# Patient Record
Sex: Male | Born: 1995 | Race: White | Hispanic: No | Marital: Single | Smoking: Current every day smoker
Health system: Southern US, Community
[De-identification: ages and names within clinical notes are randomized; demographics above are authoritative.]

## PROBLEM LIST (undated history)

## (undated) DIAGNOSIS — F840 Autistic disorder: Secondary | ICD-10-CM

## (undated) DIAGNOSIS — R569 Unspecified convulsions: Secondary | ICD-10-CM

## (undated) DIAGNOSIS — F209 Schizophrenia, unspecified: Secondary | ICD-10-CM

## (undated) DIAGNOSIS — F909 Attention-deficit hyperactivity disorder, unspecified type: Secondary | ICD-10-CM

## (undated) DIAGNOSIS — F913 Oppositional defiant disorder: Secondary | ICD-10-CM

## (undated) DIAGNOSIS — F319 Bipolar disorder, unspecified: Secondary | ICD-10-CM

## (undated) HISTORY — PX: TOOTH EXTRACTION: SUR596

---
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
## 2015-12-04 ENCOUNTER — Emergency Department (HOSPITAL_COMMUNITY)
Admission: EM | Admit: 2015-12-04 | Discharge: 2015-12-05 | Disposition: A | Discharge status: Home / Self Care | Payer: Medicaid Other | Attending: Emergency Medicine | Admitting: Emergency Medicine

## 2015-12-04 DIAGNOSIS — R259 Unspecified abnormal involuntary movements: Secondary | ICD-10-CM | POA: Diagnosis not present

## 2015-12-04 DIAGNOSIS — Z79899 Other long term (current) drug therapy: Secondary | ICD-10-CM | POA: Insufficient documentation

## 2015-12-04 DIAGNOSIS — Z046 Encounter for general psychiatric examination, requested by authority: Secondary | ICD-10-CM

## 2015-12-04 DIAGNOSIS — F918 Other conduct disorders: Secondary | ICD-10-CM | POA: Diagnosis present

## 2015-12-04 DIAGNOSIS — H109 Unspecified conjunctivitis: Secondary | ICD-10-CM | POA: Diagnosis not present

## 2015-12-04 LAB — COMPREHENSIVE METABOLIC PANEL
ALT: 18 U/L (ref 17–63)
ANION GAP: 4 — AB (ref 5–15)
AST: 17 U/L (ref 15–41)
Albumin: 4.4 g/dL (ref 3.5–5.0)
Alkaline Phosphatase: 81 U/L (ref 38–126)
BILIRUBIN TOTAL: 0.6 mg/dL (ref 0.3–1.2)
BUN: 13 mg/dL (ref 6–20)
CHLORIDE: 107 mmol/L (ref 101–111)
CO2: 27 mmol/L (ref 22–32)
Calcium: 9.6 mg/dL (ref 8.9–10.3)
Creatinine, Ser: 1.03 mg/dL (ref 0.61–1.24)
Glucose, Bld: 85 mg/dL (ref 65–99)
POTASSIUM: 4.1 mmol/L (ref 3.5–5.1)
Sodium: 138 mmol/L (ref 135–145)
TOTAL PROTEIN: 7.6 g/dL (ref 6.5–8.1)

## 2015-12-04 LAB — CBC
HCT: 36.6 % — ABNORMAL LOW (ref 39.0–52.0)
Hemoglobin: 12.8 g/dL — ABNORMAL LOW (ref 13.0–17.0)
MCH: 29.4 pg (ref 26.0–34.0)
MCHC: 35 g/dL (ref 30.0–36.0)
MCV: 83.9 fL (ref 78.0–100.0)
PLATELETS: 224 10*3/uL (ref 150–400)
RBC: 4.36 MIL/uL (ref 4.22–5.81)
RDW: 12.1 % (ref 11.5–15.5)
WBC: 5.8 10*3/uL (ref 4.0–10.5)

## 2015-12-04 LAB — RAPID URINE DRUG SCREEN, HOSP PERFORMED
AMPHETAMINES: NOT DETECTED
BENZODIAZEPINES: NOT DETECTED
Barbiturates: NOT DETECTED
COCAINE: NOT DETECTED
OPIATES: NOT DETECTED
Tetrahydrocannabinol: NOT DETECTED

## 2015-12-04 LAB — SALICYLATE LEVEL

## 2015-12-04 LAB — LITHIUM LEVEL

## 2015-12-04 LAB — ETHANOL

## 2015-12-04 LAB — ACETAMINOPHEN LEVEL

## 2015-12-04 MED ORDER — DIPHENHYDRAMINE HCL 25 MG PO CAPS
25.0000 mg | ORAL_CAPSULE | Freq: Once | ORAL | Status: AC
  Administered 2015-12-04: 25 mg via ORAL
  Filled 2015-12-04: qty 1

## 2015-12-04 MED ORDER — ERYTHROMYCIN 5 MG/GM OP OINT
1.0000 "application " | TOPICAL_OINTMENT | Freq: Four times a day (QID) | OPHTHALMIC | Status: DC
  Administered 2015-12-04 – 2015-12-05 (×3): 1 via OPHTHALMIC
  Filled 2015-12-04: qty 3.5

## 2015-12-04 NOTE — ED Notes (Signed)
Bed: WA31 Expected date:  Expected time:  Means of arrival:  Comments: 
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
## 2015-12-04 NOTE — BH Assessment (Signed)
Assessment completed. Consulted Maryjean Mornharles Kober, PA-C who recommended inpatient treatment. Informed Antony MaduraKelly Humes, PA-C of the recommendation.

## 2015-12-04 NOTE — ED Provider Notes (Signed)
[facility]-EMERGENCY DEPT Provider Note   CSN: [id number] Arrival date & time: 12/04/15  1803  By signing my name below, I, Rosario AdieWilliam Andrew Hiatt, attest that this documentation has been prepared under the direction and in the presence of TRW AutomotiveKelly Bedford Winsor, PA-C.  Electronically Signed: Rosario AdieWilliam Andrew Hiatt, ED Scribe. 12/04/15. 8:21 PM.  History   Chief Complaint Chief Complaint  Patient presents with  . Aggressive Behavior  . Suicidal   The history is provided by the patient, a relative and the police. No language interpreter was used.   HPI Comments: [name] is a 20 y.o. male BIB [employer] under IVC, who presents to the Emergency Department with aggressive behavior. Pt reports that his sister put him under IVC because he has been more aggressive in his home recently. Pt reports that he needs new medicines because he has been running out of them, and has been out of his morning medicines x ~1 month. He states that he recently was kicked out of his independent living facility and has been living with his sister, and has been unable to see someone to fill because he has to move away from the area where his old therapist was located. He last took his nightly medications last night. He is not currently followed by a new therapist. His sister also noted on his IVC forms that the pt has been walking in on her while she is changing and staring at her. Denies SI/HI at this time. Denies alcohol or illicit drug usage.   He is also complaining of left eye pain onset PTA. He additionally states that the eye has been pruritic and more red. Denies recent trauma but notes that he has been rubbing it constantly. Denies lash crusting, nasal congestion, rhinorrhea, or recent visual changes.   No past medical history on file.  There are no active problems to display for this patient.  No past surgical history on file.  Home Medications    Prior to Admission medications   Not on File   Family History No family  history on file.  Social History Social History  Substance Use Topics  . Smoking status: Not on file  . Smokeless tobacco: Not on file  . Alcohol use Not on file   Allergies   Review of patient's allergies indicates not on file.  Review of Systems Review of Systems  Constitutional: Negative for fever.  Eyes: Positive for redness. Negative for visual disturbance.  Psychiatric/Behavioral: Positive for behavioral problems. Negative for suicidal ideas.  A complete 10 system review of systems was obtained and all systems are negative except as noted in the HPI and PMH.    Physical Exam Updated Vital Signs BP 122/68 (BP Location: Left Arm)   Pulse 78   Temp 98.3 F (36.8 C) (Oral)   Resp 20   SpO2 99%   Physical Exam  Constitutional: He is oriented to person, place, and time. He appears well-developed and well-nourished. No distress.  Nontoxic appearing  HENT:  Head: Normocephalic and atraumatic.  Eyes: EOM are normal. Left eye exhibits no discharge. Right conjunctiva is not injected. Left conjunctiva is injected. Left conjunctiva has no hemorrhage. No scleral icterus.  No blepharitis or peritorbital edema. Normal EOMs. No subconjunctival hemorrhage, proptosis, or hyphema. No discharge or tearing from the left eye.  Neck: Normal range of motion.  Pulmonary/Chest: Effort normal. No respiratory distress.  Musculoskeletal: Normal range of motion.  Neurological: He is alert and oriented to person, place, and time.  Skin: Skin  is warm and dry. No rash noted. He is not diaphoretic. No erythema. No pallor.  Psychiatric: His speech is rapid and/or pressured. He is hyperactive (mild). He expresses no homicidal and no suicidal ideation.  Nursing note and vitals reviewed.  ED Treatments / Results  DIAGNOSTIC STUDIES: Oxygen Saturation is 99% on RA, normal by my interpretation.   COORDINATION OF CARE: 8:21 PM-Discussed next steps with pt. Pt verbalized understanding and is agreeable  with the plan.   Labs (all labs ordered are listed, but only abnormal results are displayed) Labs Reviewed  COMPREHENSIVE METABOLIC PANEL - Abnormal; Notable for the following:       Result Value   Anion gap 4 (*)    All other components within normal limits  ACETAMINOPHEN LEVEL - Abnormal; Notable for the following:    Acetaminophen (Tylenol), Serum <10 (*)    All other components within normal limits  CBC - Abnormal; Notable for the following:    Hemoglobin 12.8 (*)    HCT 36.6 (*)    All other components within normal limits  LITHIUM LEVEL - Abnormal; Notable for the following:    Lithium Lvl <0.06 (*)    All other components within normal limits  ETHANOL  SALICYLATE LEVEL  URINE RAPID DRUG SCREEN, HOSP PERFORMED   EKG  EKG Interpretation None      Radiology No results found.  Procedures Procedures (including critical care time)  Medications Ordered in ED Medications - No data to display   Initial Impression / Assessment and Plan / ED Course  I have reviewed the triage vital signs and the nursing notes.  Pertinent labs & imaging results that were available during my care of the patient were reviewed by me and considered in my medical decision making (see chart for details).  Clinical Course    Patient medically cleared. He has been prescribed erythromycin for conjunctivitis of left eye. TTS recommending inpatient treatment. Placement pending. Disposition to be determined by oncoming ED provider.   Final Clinical Impressions(s) / ED Diagnoses   Final diagnoses:  Involuntary commitment  Conjunctivitis of left eye    New Prescriptions New Prescriptions   No medications on file   I personally performed the services described in this documentation, which was scribed in my presence. The recorded information has been reviewed and is accurate.       Antony MaduraKelly Chatara Lucente, PA-C 12/05/15 [id number]    Gwyneth SproutWhitney Plunkett, MD 12/06/15 [phone]

## 2015-12-04 NOTE — ED Triage Notes (Signed)
Pt here with [employer].  IVC by sister.  Paper states pt is aggressive at home scratching and hitting himself. Walking in on sister when she is changing and staring at her. Pt has MR and is autistic.  [employer] was called out last night for behavior.  Pt has not been taking his meds as prescribed.  Pt states he is here for his meds only and then he can go home.  Last time he took his meds last night. Says he is out of his morning and 2;00 meds.

## 2015-12-05 MED ORDER — ERYTHROMYCIN 5 MG/GM OP OINT: 1.0000 "application " | TOPICAL_OINTMENT | Freq: Four times a day (QID) | OPHTHALMIC | 0 refills | Status: DC

## 2015-12-05 MED ORDER — BENZTROPINE MESYLATE 1 MG PO TABS
0.5000 mg | ORAL_TABLET | Freq: Every day | ORAL | Status: DC
  Administered 2015-12-05: 1 mg via ORAL
  Filled 2015-12-05: qty 1

## 2015-12-05 MED ORDER — LORATADINE 10 MG PO TABS
10.0000 mg | ORAL_TABLET | Freq: Every day | ORAL | Status: DC
  Administered 2015-12-05: 10 mg via ORAL
  Filled 2015-12-05 (×2): qty 1

## 2015-12-05 MED ORDER — LITHIUM CARBONATE 300 MG PO CAPS
300.0000 mg | ORAL_CAPSULE | Freq: Two times a day (BID) | ORAL | 0 refills | Status: DC
Start: 2015-12-05 — End: 2019-02-12

## 2015-12-05 MED ORDER — CLONIDINE HCL 0.1 MG PO TABS: 0.1000 mg | ORAL_TABLET | Freq: Every day | ORAL | Status: DC

## 2015-12-05 MED ORDER — HALOPERIDOL 5 MG PO TABS
5.0000 mg | ORAL_TABLET | Freq: Three times a day (TID) | ORAL | Status: DC
  Administered 2015-12-05 (×2): 5 mg via ORAL
  Filled 2015-12-05 (×2): qty 1

## 2015-12-05 MED ORDER — CHLORPROMAZINE HCL 25 MG PO TABS: 50.0000 mg | ORAL_TABLET | Freq: Every day | ORAL | Status: DC

## 2015-12-05 MED ORDER — CHLORPROMAZINE HCL 25 MG PO TABS
25.0000 mg | ORAL_TABLET | Freq: Two times a day (BID) | ORAL | Status: DC
  Administered 2015-12-05 (×2): 25 mg via ORAL
  Filled 2015-12-05 (×2): qty 1

## 2015-12-05 MED ORDER — LITHIUM CARBONATE 300 MG PO CAPS: 1500.0000 mg | ORAL_CAPSULE | Freq: Every day | ORAL | Status: DC

## 2015-12-05 NOTE — [facility] Suicide Risk Assessment (Cosign Needed)
Suicide Risk Assessment  Discharge Assessment   [facility]   Principal Problem: <principal problem not specified> Discharge Diagnoses: There are no active problems to display for this patient.   Total Time spent with patient: 30 minutes  Musculoskeletal: Strength & Muscle Tone: within normal limits Gait & Station: normal Patient leans: N/A  Psychiatric Specialty Exam:     Blood pressure 128/68, pulse 67, temperature 97.6 F (36.4 C), temperature source Oral, resp. rate 14, SpO2 99 %.There is no height or weight on file to calculate BMI.  General Appearance: Casual  Eye Contact::  Good  Speech:  Clear and Coherent  Volume:  Normal  Mood:  Euthymic  Affect:  Appropriate  Thought Process:  Coherent  Orientation:  Full (Time, Place, and Person)  Thought Content:  Logical  Suicidal Thoughts:  No  Homicidal Thoughts:  No  Memory:  Immediate;   Fair Recent;   Fair Remote;   Fair  Judgement:  Fair  Insight:  Fair  Psychomotor Activity:  Normal  Concentration:  Good  Recall:  Good  Fund of Knowledge:Good  Language: Good  Akathisia:  Negative  Handed:  Right  AIMS (if indicated):     Assets:  Housing  Sleep:     Cognition: WNL  ADL's:  Intact   Mental Status Per Nursing Assessment::   On Admission:     Demographic Factors:  Adolescent or young adult and autism, MR  Loss Factors: NA  Historical Factors: Impulsivity  Risk Reduction Factors:   Positive social support  Continued Clinical Symptoms:  Medical Diagnoses and Treatments/Surgeries  Cognitive Features That Contribute To Risk:  Closed-mindedness    Suicide Risk:  Minimal: No identifiable suicidal ideation.  Patients presenting with no risk factors but with morbid ruminations; may be classified as minimal risk based on the severity of the depressive symptoms   Plan Of Care/Follow-up recommendations:  Activity:  as tol Diet:  as tol  Patient instructed to care for  irritated eye which he reports as "pink eye"  With warm water.   Will be restarted on Lithium 300 mg BID (lowest dose to ensure safety)  Lindwood QuaSheila May Duy Lemming, NP [facility] 12/05/2015, 4:18 PM

## 2015-12-31 ENCOUNTER — Telehealth: Payer: Self-pay | Admitting: Nurse Practitioner

## 2016-03-29 ENCOUNTER — Encounter (HOSPITAL_COMMUNITY): Payer: Self-pay | Admitting: Emergency Medicine

## 2016-03-29 ENCOUNTER — Emergency Department (HOSPITAL_COMMUNITY)
Admission: EM | Admit: 2016-03-29 | Discharge: 2016-03-30 | Disposition: A | Discharge status: Home / Self Care | Payer: Medicaid Other | Attending: Emergency Medicine | Admitting: Emergency Medicine

## 2016-03-29 DIAGNOSIS — Z79899 Other long term (current) drug therapy: Secondary | ICD-10-CM | POA: Diagnosis not present

## 2016-03-29 DIAGNOSIS — R112 Nausea with vomiting, unspecified: Secondary | ICD-10-CM | POA: Diagnosis not present

## 2016-03-29 DIAGNOSIS — F17210 Nicotine dependence, cigarettes, uncomplicated: Secondary | ICD-10-CM | POA: Diagnosis not present

## 2016-03-29 DIAGNOSIS — F840 Autistic disorder: Secondary | ICD-10-CM | POA: Diagnosis not present

## 2016-03-29 DIAGNOSIS — R1110 Vomiting, unspecified: Secondary | ICD-10-CM

## 2016-03-29 HISTORY — DX: Autistic disorder: F84.0

## 2016-03-29 HISTORY — DX: Unspecified convulsions: R56.9

## 2016-03-29 LAB — URINALYSIS, ROUTINE W REFLEX MICROSCOPIC
BILIRUBIN URINE: NEGATIVE
Glucose, UA: NEGATIVE mg/dL
HGB URINE DIPSTICK: NEGATIVE
KETONES UR: 5 mg/dL — AB
Leukocytes, UA: NEGATIVE
NITRITE: NEGATIVE
PH: 7 (ref 5.0–8.0)
Protein, ur: NEGATIVE mg/dL
Specific Gravity, Urine: 1.015 (ref 1.005–1.030)

## 2016-03-29 LAB — COMPREHENSIVE METABOLIC PANEL
ALBUMIN: 4.7 g/dL (ref 3.5–5.0)
ALK PHOS: 91 U/L (ref 38–126)
ALT: 15 U/L — ABNORMAL LOW (ref 17–63)
AST: 18 U/L (ref 15–41)
Anion gap: 6 (ref 5–15)
BILIRUBIN TOTAL: 1 mg/dL (ref 0.3–1.2)
BUN: 14 mg/dL (ref 6–20)
CALCIUM: 9.5 mg/dL (ref 8.9–10.3)
CO2: 25 mmol/L (ref 22–32)
Chloride: 106 mmol/L (ref 101–111)
Creatinine, Ser: 0.9 mg/dL (ref 0.61–1.24)
GFR calc Af Amer: >60 mL/min (ref >60)
GFR calc non Af Amer: >60 mL/min (ref >60)
GLUCOSE: 104 mg/dL — AB (ref 65–99)
Potassium: 3.7 mmol/L (ref 3.5–5.1)
Sodium: 137 mmol/L (ref 135–145)
TOTAL PROTEIN: 7.6 g/dL (ref 6.5–8.1)

## 2016-03-29 LAB — CBC
HEMATOCRIT: 44.2 % (ref 39.0–52.0)
Hemoglobin: 15.4 g/dL (ref 13.0–17.0)
MCH: 29.8 pg (ref 26.0–34.0)
MCHC: 34.8 g/dL (ref 30.0–36.0)
MCV: 85.7 fL (ref 78.0–100.0)
Platelets: 199 10*3/uL (ref 150–400)
RBC: 5.16 MIL/uL (ref 4.22–5.81)
RDW: 12.4 % (ref 11.5–15.5)
WBC: 13.8 10*3/uL — ABNORMAL HIGH (ref 4.0–10.5)

## 2016-03-29 LAB — LIPASE, BLOOD: Lipase: 20 U/L (ref 11–51)

## 2016-03-29 MED ORDER — DICYCLOMINE HCL 10 MG PO CAPS
10.0000 mg | ORAL_CAPSULE | Freq: Once | ORAL | Status: AC
  Administered 2016-03-29: 10 mg via ORAL
  Filled 2016-03-29: qty 1

## 2016-03-29 MED ORDER — SODIUM CHLORIDE 0.9 % IV BOLUS (SEPSIS)
1000.0000 mL | Freq: Once | INTRAVENOUS | Status: AC
  Administered 2016-03-29: 1000 mL via INTRAVENOUS

## 2016-03-29 MED ORDER — ONDANSETRON HCL 4 MG/2ML IJ SOLN
4.0000 mg | Freq: Once | INTRAMUSCULAR | Status: AC
  Administered 2016-03-29: 4 mg via INTRAVENOUS
  Filled 2016-03-29: qty 2

## 2016-03-29 NOTE — ED Notes (Signed)
Pt requesting UDS due to recent drug use

## 2016-03-29 NOTE — ED Triage Notes (Signed)
[facility] is from home [facility] Patient is complaining of nausea, vomiting, and left flank pain that radiates around abd to right lower quad. Pt takes Lithium but not has he is prescribed.

## 2016-03-29 NOTE — ED Provider Notes (Signed)
[facility]-EMERGENCY DEPT Provider Note   CSN: [id number] Arrival date & time: 03/29/16  [id number]     History   Chief Complaint Chief Complaint  Patient presents with  . Flank Pain  . Emesis    HPI [name] is a 20 y.o. male.  Patient is a 20 year old male with a history of autism and seizures who presents with abdominal pain and vomiting. He states he ate honey bun earlier today and started vomiting. He has some associated left-sided crampy abdominal pain. No diarrhea. No known fevers. No cough or congestion. No hematemesis. He has a friend who had similar symptoms recently.      Past Medical History:  Diagnosis Date  . Autism   . Seizures (HCC)    none since 20 years old    There are no active problems to display for this patient.   Past Surgical History:  Procedure Laterality Date  . TOOTH EXTRACTION         Home Medications    Prior to Admission medications   Medication Sig Start Date End Date Taking? Authorizing Provider  diphenhydrAMINE (BENADRYL) 25 MG tablet Take 25 mg by mouth every 6 (six) hours as needed for sleep.   Yes Historical Provider, MD  OXcarbazepine (TRILEPTAL) 150 MG tablet Take 150 mg by mouth 2 (two) times daily.   Yes Historical Provider, MD  benztropine (COGENTIN) 0.5 MG tablet Take 0.5 mg by mouth daily.    Historical Provider, MD  cetirizine (ZYRTEC) 10 MG tablet Take 10 mg by mouth daily.    Historical Provider, MD  chlorproMAZINE (THORAZINE) 25 MG tablet Take 25 mg by mouth 2 (two) times daily. Am and afternoon 08/11/15   Historical Provider, MD  chlorproMAZINE (THORAZINE) 50 MG tablet Take 50 mg by mouth at bedtime.    Historical Provider, MD  cloNIDine (CATAPRES) 0.1 MG tablet Take 0.1 mg by mouth at bedtime.    Historical Provider, MD  erythromycin ophthalmic ointment Place 1 application into the left eye every 6 (six) hours. Left eye irritation Patient not taking: Reported on 03/29/2016 12/05/15   Adonis BrookSheila Agustin, NP  haloperidol  (HALDOL) 5 MG tablet Take 5 mg by mouth 3 (three) times daily. 08/11/15   Historical Provider, MD  lithium carbonate 300 MG capsule Take 1 capsule (300 mg total) by mouth 2 (two) times daily with a meal. 12/05/15   Adonis BrookSheila Agustin, NP  paliperidone (INVEGA SUSTENNA) 156 MG/ML SUSP injection Inject 156 mg into the muscle every 28 (twenty-eight) days. July 2017    Historical Provider, MD    Family History No family history on file.  Social History Social History  Substance Use Topics  . Smoking status: Current Some Day Smoker    Packs/day: 1.00    Types: Cigarettes  . Smokeless tobacco: Never Used  . Alcohol use No     Allergies   Other   Review of Systems Review of Systems  Constitutional: Negative for chills, diaphoresis, fatigue and fever.  HENT: Negative for congestion, rhinorrhea and sneezing.   Eyes: Negative.   Respiratory: Negative for cough, chest tightness and shortness of breath.   Cardiovascular: Negative for chest pain and leg swelling.  Gastrointestinal: Positive for abdominal pain, nausea and vomiting. Negative for blood in stool and diarrhea.  Genitourinary: Negative for difficulty urinating, flank pain, frequency and hematuria.  Musculoskeletal: Negative for arthralgias and back pain.  Skin: Negative for rash.  Neurological: Negative for dizziness, speech difficulty, weakness, numbness and headaches.     Physical  Exam Updated Vital Signs BP 123/77 (BP Location: Left Arm)   Pulse 92   Temp 98.3 F (36.8 C) (Oral)   Resp 18   Ht 5' 8.5" (1.74 m)   SpO2 98%   Physical Exam  Constitutional: He is oriented to person, place, and time. He appears well-developed and well-nourished.  HENT:  Head: Normocephalic and atraumatic.  Eyes: Pupils are equal, round, and reactive to light.  Neck: Normal range of motion. Neck supple.  Cardiovascular: Normal rate, regular rhythm and normal heart sounds.   Pulmonary/Chest: Effort normal and breath sounds normal. No  respiratory distress. He has no wheezes. He has no rales. He exhibits no tenderness.  Abdominal: Soft. Bowel sounds are normal. There is tenderness (Mild tenderness diffusely). There is no rebound and no guarding.  Musculoskeletal: Normal range of motion. He exhibits no edema.  Lymphadenopathy:    He has no cervical adenopathy.  Neurological: He is alert and oriented to person, place, and time.  Skin: Skin is warm and dry. No rash noted.  Psychiatric: He has a normal mood and affect.     ED Treatments / Results  Labs (all labs ordered are listed, but only abnormal results are displayed) Labs Reviewed  COMPREHENSIVE METABOLIC PANEL - Abnormal; Notable for the following:       Result Value   Glucose, Bld 104 (*)    ALT 15 (*)    All other components within normal limits  CBC - Abnormal; Notable for the following:    WBC 13.8 (*)    All other components within normal limits  URINALYSIS, ROUTINE W REFLEX MICROSCOPIC - Abnormal; Notable for the following:    Ketones, ur 5 (*)    All other components within normal limits  LIPASE, BLOOD  RAPID URINE DRUG SCREEN, HOSP PERFORMED    EKG  EKG Interpretation None       Radiology No results found.  Procedures Procedures (including critical care time)  Medications Ordered in ED Medications  sodium chloride 0.9 % bolus 1,000 mL (0 mLs Intravenous Stopped 03/29/16 2315)  ondansetron (ZOFRAN) injection 4 mg (4 mg Intravenous Given 03/29/16 2125)  dicyclomine (BENTYL) capsule 10 mg (10 mg Oral Given 03/29/16 2125)     Initial Impression / Assessment and Plan / ED Course  I have reviewed the triage vital signs and the nursing notes.  Pertinent labs & imaging results that were available during my care of the patient were reviewed by me and considered in my medical decision making (see chart for details).  Clinical Course     Patient presents with nausea vomiting abdominal pain. He was given IV fluids Bentyl and Zofran. He's  feeling much better and wants to go home. His labs are non-concerning. He has no ongoing abdominal pain. He was discharged home in good condition. Return precautions were given.  Final Clinical Impressions(s) / ED Diagnoses   Final diagnoses:  Non-intractable vomiting, presence of nausea not specified, unspecified vomiting type    New Prescriptions New Prescriptions   No medications on file     Rolan BuccoMelanie Particia Strahm, MD 03/29/16 2330

## 2016-03-30 LAB — RAPID URINE DRUG SCREEN, HOSP PERFORMED
Amphetamines: NOT DETECTED
BENZODIAZEPINES: NOT DETECTED
Barbiturates: NOT DETECTED
COCAINE: NOT DETECTED
OPIATES: NOT DETECTED
Tetrahydrocannabinol: POSITIVE — AB

## 2016-05-20 ENCOUNTER — Emergency Department (HOSPITAL_COMMUNITY): Payer: Medicaid Other

## 2016-05-20 ENCOUNTER — Encounter (HOSPITAL_COMMUNITY): Payer: Self-pay | Admitting: Emergency Medicine

## 2016-05-20 ENCOUNTER — Emergency Department (HOSPITAL_COMMUNITY)
Admission: EM | Admit: 2016-05-20 | Discharge: 2016-05-20 | Discharge status: Against Medical Advice | Payer: Medicaid Other | Attending: Emergency Medicine | Admitting: Emergency Medicine

## 2016-05-20 DIAGNOSIS — J111 Influenza due to unidentified influenza virus with other respiratory manifestations: Secondary | ICD-10-CM | POA: Insufficient documentation

## 2016-05-20 DIAGNOSIS — Z79899 Other long term (current) drug therapy: Secondary | ICD-10-CM | POA: Insufficient documentation

## 2016-05-20 DIAGNOSIS — F840 Autistic disorder: Secondary | ICD-10-CM | POA: Insufficient documentation

## 2016-05-20 DIAGNOSIS — F17210 Nicotine dependence, cigarettes, uncomplicated: Secondary | ICD-10-CM | POA: Insufficient documentation

## 2016-05-20 DIAGNOSIS — R05 Cough: Secondary | ICD-10-CM | POA: Diagnosis present

## 2016-05-20 DIAGNOSIS — R69 Illness, unspecified: Secondary | ICD-10-CM

## 2016-05-20 LAB — CBC WITH DIFFERENTIAL/PLATELET
Basophils Absolute: 0 10*3/uL (ref 0.0–0.1)
Basophils Relative: 0 %
EOS ABS: 0 10*3/uL (ref 0.0–0.7)
Eosinophils Relative: 0 %
HEMATOCRIT: 44.6 % (ref 39.0–52.0)
HEMOGLOBIN: 15.1 g/dL (ref 13.0–17.0)
LYMPHS ABS: 1 10*3/uL (ref 0.7–4.0)
Lymphocytes Relative: 13 %
MCH: 28.7 pg (ref 26.0–34.0)
MCHC: 33.9 g/dL (ref 30.0–36.0)
MCV: 84.8 fL (ref 78.0–100.0)
MONOS PCT: 13 %
Monocytes Absolute: 1 10*3/uL (ref 0.1–1.0)
NEUTROS ABS: 5.7 10*3/uL (ref 1.7–7.7)
NEUTROS PCT: 74 %
Platelets: 160 10*3/uL (ref 150–400)
RBC: 5.26 MIL/uL (ref 4.22–5.81)
RDW: 12.9 % (ref 11.5–15.5)
WBC: 7.8 10*3/uL (ref 4.0–10.5)

## 2016-05-20 LAB — URINALYSIS, ROUTINE W REFLEX MICROSCOPIC
BILIRUBIN URINE: NEGATIVE
Glucose, UA: NEGATIVE mg/dL
Hgb urine dipstick: NEGATIVE
Ketones, ur: 5 mg/dL — AB
LEUKOCYTES UA: NEGATIVE
NITRITE: NEGATIVE
PH: 7 (ref 5.0–8.0)
Protein, ur: NEGATIVE mg/dL
SPECIFIC GRAVITY, URINE: 1.021 (ref 1.005–1.030)

## 2016-05-20 LAB — COMPREHENSIVE METABOLIC PANEL
ALBUMIN: 4.7 g/dL (ref 3.5–5.0)
ALK PHOS: 83 U/L (ref 38–126)
ALT: 14 U/L — AB (ref 17–63)
AST: 18 U/L (ref 15–41)
Anion gap: 10 (ref 5–15)
BILIRUBIN TOTAL: 0.7 mg/dL (ref 0.3–1.2)
BUN: 17 mg/dL (ref 6–20)
CALCIUM: 9.6 mg/dL (ref 8.9–10.3)
CO2: 24 mmol/L (ref 22–32)
CREATININE: 0.91 mg/dL (ref 0.61–1.24)
Chloride: 102 mmol/L (ref 101–111)
GFR calc non Af Amer: >60 mL/min (ref >60)
GLUCOSE: 100 mg/dL — AB (ref 65–99)
Potassium: 3.5 mmol/L (ref 3.5–5.1)
SODIUM: 136 mmol/L (ref 135–145)
TOTAL PROTEIN: 8 g/dL (ref 6.5–8.1)

## 2016-05-20 LAB — LIPASE, BLOOD: Lipase: 13 U/L (ref 11–51)

## 2016-05-20 MED ORDER — KETOROLAC TROMETHAMINE 30 MG/ML IJ SOLN
30.0000 mg | Freq: Once | INTRAMUSCULAR | Status: AC
  Administered 2016-05-20: 30 mg via INTRAVENOUS
  Filled 2016-05-20: qty 1

## 2016-05-20 NOTE — ED Provider Notes (Signed)
[facility]-EMERGENCY DEPT Provider Note   CSN: [id number] Arrival date & time: 05/20/16  1930     History   Chief Complaint Chief Complaint  Patient presents with  . Cough  . Dysuria    HPI Lupe CarneyShawn Cerreta is a 21 y.o. male.  HPI  21 year old male who presents with cough, body aches, fever and chills for 2 days. His sister has been ill w/ nausea, vomiting and diarrhea. He has noticed associated burning w/ urination and frequency. No penile discharge, flank pain, nausea, vomiting, or diarrhea. Subjective fevers at home. He has a history of seizure disorder and autism spectrum disorder. Took Tylenol earlier today, without improvement in his body aches.  Past Medical History:  Diagnosis Date  . Autism   . Seizures (HCC)    none since 21 years old    There are no active problems to display for this patient.   Past Surgical History:  Procedure Laterality Date  . TOOTH EXTRACTION         Home Medications    Prior to Admission medications   Medication Sig Start Date End Date Taking? Authorizing Provider  acetaminophen (TYLENOL) 500 MG tablet Take 1,000 mg by mouth every 6 (six) hours as needed for mild pain.   Yes Historical Provider, MD  diphenhydrAMINE (BENADRYL) 25 MG tablet Take 25 mg by mouth every 6 (six) hours as needed for sleep.   Yes Historical Provider, MD  erythromycin ophthalmic ointment Place 1 application into the left eye every 6 (six) hours. Left eye irritation Patient not taking: Reported on 03/29/2016 12/05/15   Adonis BrookSheila Agustin, NP  lithium carbonate 300 MG capsule Take 1 capsule (300 mg total) by mouth 2 (two) times daily with a meal. Patient not taking: Reported on 05/20/2016 12/05/15   Adonis BrookSheila Agustin, NP    Family History History reviewed. No pertinent family history.  Social History Social History  Substance Use Topics  . Smoking status: Current Some Day Smoker    Packs/day: 1.00    Types: Cigarettes  . Smokeless tobacco: Never Used  . Alcohol use No      Allergies   Other   Review of Systems Review of Systems 10/14 systems reviewed and are negative other than those stated in the HPI   Physical Exam Updated Vital Signs BP 117/79   Pulse 101   Temp 99.3 F (37.4 C) (Oral)   Resp 18   SpO2 98%   Physical Exam Physical Exam  Nursing note and vitals reviewed. Constitutional: non-toxic, and in no acute distress Head: Normocephalic and atraumatic.  Mouth/Throat: Oropharynx is clear and moist.  Neck: Normal range of motion. Neck supple.  Cardiovascular: Normal rate and regular rhythm.   Pulmonary/Chest: Effort normal and breath sounds normal.  Abdominal: Soft. There is no tenderness. There is no rebound and no guarding.  Musculoskeletal: Normal range of motion.  Neurological: Alert, no facial droop, fluent speech, moves all extremities symmetrically Skin: Skin is warm and dry.  Psychiatric: Cooperative   ED Treatments / Results  Labs (all labs ordered are listed, but only abnormal results are displayed) Labs Reviewed  URINALYSIS, ROUTINE W REFLEX MICROSCOPIC - Abnormal; Notable for the following:       Result Value   Ketones, ur 5 (*)    All other components within normal limits  COMPREHENSIVE METABOLIC PANEL - Abnormal; Notable for the following:    Glucose, Bld 100 (*)    ALT 14 (*)    All other components within normal  limits  URINE CULTURE  CBC WITH DIFFERENTIAL/PLATELET  LIPASE, BLOOD    EKG  EKG Interpretation None       Radiology Dg Chest 2 View  Result Date: 05/20/2016 CLINICAL DATA:  Acute onset of cough and generalized body aches. Dysuria. Initial encounter. EXAM: CHEST  2 VIEW COMPARISON:  None. FINDINGS: The lungs are well-aerated and clear. There is no evidence of focal opacification, pleural effusion or pneumothorax. The heart is normal in size; the mediastinal contour is within normal limits. No acute osseous abnormalities are seen. IMPRESSION: No acute cardiopulmonary process seen.  Electronically Signed   By: Roanna Raider M.D.   On: 05/20/2016 20:42    Procedures Procedures (including critical care time)  Medications Ordered in ED Medications  ketorolac (TORADOL) 30 MG/ML injection 30 mg (30 mg Intravenous Given 05/20/16 2017)     Initial Impression / Assessment and Plan / ED Course  I have reviewed the triage vital signs and the nursing notes.  Pertinent labs & imaging results that were available during my care of the patient were reviewed by me and considered in my medical decision making (see chart for details).     Patient is well-appearing in no acute distress with stable vital signs. Presenting with influenza-like illness. Blood work reassuring and chest x-ray shows no acute cardiopulmonary processes such as pneumonia or edema. Although having urinary symptoms, his urine is clear without evidence of UTI. Patient left prior to me discussed supportive care management for potential discharge home.   Final Clinical Impressions(s) / ED Diagnoses   Final diagnoses:  Influenza-like illness    New Prescriptions New Prescriptions   No medications on file     Lavera Guise, MD 05/20/16 2234
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
## 2016-05-20 NOTE — ED Notes (Signed)
Asked pt if he could give a urine sample, but said he's not able to. 

## 2016-05-20 NOTE — ED Triage Notes (Signed)
Brought in by EMS from home with c/o cough for 2 days and dysuria.  Pt also reports "headache on coughing" and increased urinary frequency and urgency.  Temp T98.6 by EMS.  Pt denies chills, nausea or vomiting.

## 2016-05-22 LAB — URINE CULTURE: Culture: <10000 — AB

## 2016-06-05 ENCOUNTER — Encounter (HOSPITAL_COMMUNITY): Payer: Self-pay | Admitting: *Deleted

## 2016-06-05 ENCOUNTER — Emergency Department (HOSPITAL_COMMUNITY): Payer: Medicaid Other

## 2016-06-05 ENCOUNTER — Emergency Department (HOSPITAL_COMMUNITY)
Admission: EM | Admit: 2016-06-05 | Discharge: 2016-06-05 | Disposition: A | Discharge status: Home / Self Care | Payer: Medicaid Other | Attending: Emergency Medicine | Admitting: Emergency Medicine

## 2016-06-05 DIAGNOSIS — F840 Autistic disorder: Secondary | ICD-10-CM | POA: Diagnosis not present

## 2016-06-05 DIAGNOSIS — Y939 Activity, unspecified: Secondary | ICD-10-CM | POA: Diagnosis not present

## 2016-06-05 DIAGNOSIS — Z79899 Other long term (current) drug therapy: Secondary | ICD-10-CM | POA: Insufficient documentation

## 2016-06-05 DIAGNOSIS — Y999 Unspecified external cause status: Secondary | ICD-10-CM | POA: Insufficient documentation

## 2016-06-05 DIAGNOSIS — Y9289 Other specified places as the place of occurrence of the external cause: Secondary | ICD-10-CM | POA: Insufficient documentation

## 2016-06-05 DIAGNOSIS — M545 Low back pain, unspecified: Secondary | ICD-10-CM

## 2016-06-05 DIAGNOSIS — W2201XA Walked into wall, initial encounter: Secondary | ICD-10-CM | POA: Insufficient documentation

## 2016-06-05 DIAGNOSIS — S62316A Displaced fracture of base of fifth metacarpal bone, right hand, initial encounter for closed fracture: Secondary | ICD-10-CM | POA: Diagnosis not present

## 2016-06-05 DIAGNOSIS — F17210 Nicotine dependence, cigarettes, uncomplicated: Secondary | ICD-10-CM | POA: Insufficient documentation

## 2016-06-05 DIAGNOSIS — S6991XA Unspecified injury of right wrist, hand and finger(s), initial encounter: Secondary | ICD-10-CM | POA: Diagnosis present

## 2016-06-05 MED ORDER — IBUPROFEN 200 MG PO TABS
600.0000 mg | ORAL_TABLET | Freq: Once | ORAL | Status: AC
  Administered 2016-06-05: 600 mg via ORAL
  Filled 2016-06-05: qty 3
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
## 2016-06-05 NOTE — ED Notes (Signed)
Informed by Radiology Tech that pt was able to get up and transfer from wheelchair onto X-ray table for x-ray.
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
## 2016-06-16 ENCOUNTER — Ambulatory Visit: Payer: Self-pay | Admitting: Family Medicine
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
## 2016-08-17 NOTE — Congregational Nurse Program (Signed)
Congregational Nurse Program Note  Date of Encounter: 08/15/2016  Past Medical History: Past Medical History:  Diagnosis Date  . Autism   . Seizures (HCC)    none since 21 years old    Encounter Details:     CNP Questionnaire - 08/15/16 0955      Patient Demographics   Is this a new or existing patient? Existing   Patient is considered a/an Not Applicable   Race Caucasian/White     Patient Assistance   Location of Patient Assistance Not Applicable   Patient's financial/insurance status Low Income;Medicaid   Uninsured Patient ([employer], trade union) No   Patient referred to apply for the following financial assistance Not Applicable   Food insecurities addressed Not Applicable   Transportation assistance No   Assistance securing medications No   Educational health offerings Behavioral health;Navigating the healthcare system;Safety     Encounter Details   Primary purpose of visit Navigating the Healthcare System;Chronic Illness/Condition Visit;Safety   Was an Emergency Department visit averted? Not Applicable   Does patient have a medical provider? No   Patient referred to Clinic   Was a mental health screening completed? (GAINS tool) No   Does patient have dental issues? No   Does patient have vision issues? No   Does your patient have an abnormal blood pressure today? No   Since previous encounter, have you referred patient for abnormal blood pressure that resulted in a new diagnosis or medication change? No   Does your patient have an abnormal blood glucose today? No   Since previous encounter, have you referred patient for abnormal blood glucose that resulted in a new diagnosis or medication change? No   Was there a life-saving intervention made? No     Client reported he has followed through with [facility] for the intake.  Is scheduled on May 2 for evaluation and medication clinic.  He has left the place he was staying and is now living on the streets.

## 2016-08-23 ENCOUNTER — Emergency Department (HOSPITAL_COMMUNITY): Payer: Medicaid Other

## 2016-08-23 ENCOUNTER — Emergency Department (HOSPITAL_COMMUNITY)
Admission: EM | Admit: 2016-08-23 | Discharge: 2016-08-23 | Disposition: A | Discharge status: Home / Self Care | Payer: Medicaid Other | Attending: Emergency Medicine | Admitting: Emergency Medicine

## 2016-08-23 ENCOUNTER — Encounter (HOSPITAL_COMMUNITY): Payer: Self-pay | Admitting: *Deleted

## 2016-08-23 DIAGNOSIS — F840 Autistic disorder: Secondary | ICD-10-CM | POA: Insufficient documentation

## 2016-08-23 DIAGNOSIS — Y929 Unspecified place or not applicable: Secondary | ICD-10-CM | POA: Insufficient documentation

## 2016-08-23 DIAGNOSIS — S8001XA Contusion of right knee, initial encounter: Secondary | ICD-10-CM | POA: Insufficient documentation

## 2016-08-23 DIAGNOSIS — Y9389 Activity, other specified: Secondary | ICD-10-CM | POA: Insufficient documentation

## 2016-08-23 DIAGNOSIS — W228XXA Striking against or struck by other objects, initial encounter: Secondary | ICD-10-CM | POA: Diagnosis not present

## 2016-08-23 DIAGNOSIS — S8991XA Unspecified injury of right lower leg, initial encounter: Secondary | ICD-10-CM | POA: Diagnosis present

## 2016-08-23 DIAGNOSIS — Y999 Unspecified external cause status: Secondary | ICD-10-CM | POA: Insufficient documentation

## 2016-08-23 DIAGNOSIS — F17210 Nicotine dependence, cigarettes, uncomplicated: Secondary | ICD-10-CM | POA: Diagnosis not present

## 2016-08-23 MED ORDER — IBUPROFEN 200 MG PO TABS
600.0000 mg | ORAL_TABLET | Freq: Once | ORAL | Status: AC
  Administered 2016-08-23: 600 mg via ORAL
  Filled 2016-08-23: qty 3
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
## 2016-08-23 NOTE — Discharge Instructions (Signed)
Read the information below.  Use the prescribed medication as directed.  Please discuss all new medications with your pharmacist.  You may return to the Emergency Department at any time for worsening condition or any new symptoms that concern you.     If you develop uncontrolled pain, weakness or numbness of the extremity, severe discoloration of the skin, or you are unable to walk or move your knee, return to the ER for a recheck.    °
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
## 2016-09-14 NOTE — Congregational Nurse Program (Signed)
Congregational Nurse Program Note  Date of Encounter: 08/26/2016  Past Medical History: Past Medical History:  Diagnosis Date  . Autism   . Seizures (HCC)    none since 21 years old    Encounter Details:     CNP Questionnaire - 08/26/16 1532      Patient Demographics   Is this a new or existing patient? Existing   Patient is considered a/an Not Applicable   Race Caucasian/White     Patient Assistance   Location of Patient Assistance Not Applicable   Patient's financial/insurance status Low Income;Medicaid   Uninsured Patient ([employer], [employer]) No   Patient referred to apply for the following financial assistance Not Applicable   Food insecurities addressed Not Applicable   Transportation assistance No   Assistance securing medications No   Educational health offerings Behavioral health;Navigating the healthcare system;Safety     Encounter Details   Primary purpose of visit Navigating the Healthcare System;Chronic Illness/Condition Visit;Safety   Was an Emergency Department visit averted? Not Applicable   Does patient have a medical provider? No   Patient referred to Clinic   Was a mental health screening completed? (GAINS tool) No   Does patient have dental issues? No   Does patient have vision issues? No   Does your patient have an abnormal blood pressure today? No   Since previous encounter, have you referred patient for abnormal blood pressure that resulted in a new diagnosis or medication change? No   Does your patient have an abnormal blood glucose today? No   Since previous encounter, have you referred patient for abnormal blood glucose that resulted in a new diagnosis or medication change? No   Was there a life-saving intervention made? No     Client more animated.  [place]  Bus leaves later today.

## 2016-09-14 NOTE — Congregational Nurse Program (Signed)
Congregational Nurse Program Note  Date of Encounter: 08/17/2016  Past Medical History: Past Medical History:  Diagnosis Date  . Autism   . Seizures (HCC)    none since 21 years old    Encounter Details:     CNP Questionnaire - 08/17/16 1529      Patient Demographics   Is this a new or existing patient? Existing   Patient is considered a/an Not Applicable   Race Caucasian/White     Patient Assistance   Location of Patient Assistance Not Applicable   Patient's financial/insurance status Low Income;Medicaid   Uninsured Patient ([employer]) No   Patient referred to apply for the following financial assistance Not Applicable   Food insecurities addressed Not Applicable   Transportation assistance No   Assistance securing medications No   Educational health offerings Behavioral health;Navigating the healthcare system;Safety     Encounter Details   Primary purpose of visit Navigating the Healthcare System;Chronic Illness/Condition Visit;Safety   Was an Emergency Department visit averted? Not Applicable   Does patient have a medical provider? No   Patient referred to Clinic   Was a mental health screening completed? (GAINS tool) No   Does patient have dental issues? No   Does patient have vision issues? No   Does your patient have an abnormal blood pressure today? No   Since previous encounter, have you referred patient for abnormal blood pressure that resulted in a new diagnosis or medication change? No   Does your patient have an abnormal blood glucose today? No   Since previous encounter, have you referred patient for abnormal blood glucose that resulted in a new diagnosis or medication change? No   Was there a life-saving intervention made? No     Client was supposed to go to [facility].  He could not locate his medication list to [facility] to [facility].  States he sold his phone yesterday.  Contacted [facility] to resend medication list.  List faxed and  copy given to client.

## 2016-09-14 NOTE — Congregational Nurse Program (Signed)
Congregational Nurse Program Note  Date of Encounter: 09/14/2016  Past Medical History: Past Medical History:  Diagnosis Date  . Autism   . Seizures ([place])    none since 21 years old    Encounter Details:     CNP Questionnaire - 09/14/16 1534      Patient Demographics   Is this a new or existing patient? Existing   Patient is considered a/an Not Applicable   Race Caucasian/White     Patient Assistance   Location of Patient Assistance Not Applicable   Patient's financial/insurance status Low Income;Medicaid   Uninsured Patient ([employer]) No   Patient referred to apply for the following financial assistance Not Applicable   Food insecurities addressed Not Applicable   Transportation assistance No   Assistance securing medications No   Educational health offerings Behavioral health;Navigating the healthcare system;Safety     Encounter Details   Primary purpose of visit Navigating the Healthcare System;Chronic Illness/Condition Visit;Safety   Was an Emergency Department visit averted? Not Applicable   Does patient have a medical provider? No   Patient referred to Clinic   Was a mental health screening completed? (GAINS tool) No   Does patient have dental issues? No   Does patient have vision issues? No   Does your patient have an abnormal blood pressure today? No   Since previous encounter, have you referred patient for abnormal blood pressure that resulted in a new diagnosis or medication change? No   Does your patient have an abnormal blood glucose today? No   Since previous encounter, have you referred patient for abnormal blood glucose that resulted in a new diagnosis or medication change? No   Was there a life-saving intervention made? No     I saw client in the lobby of [facility] and he asked to speak to me.  His speech was slow and difficult to understand.  He was vague about his return from [place], just stating it did not "work out".  Is now sleeping on the  streets.  States he has decided to stop taking his mental health meds.  Discussed with him the importance of continuing medications and treatment.  He refuses.  When asked what he wanted from his time with me, stated he wants help with housing.  Client has a history of being in a group home and by his presentation during visits, it is questionable if client would be able to manage living alone.  He does have a payor, which is his sister.  I asked to speak with his sister and he declined.  Informed client that I needed to talk to him and his sister together about housing.  Client attempted to call his sister, no answer, he left a message.    I met with [facility] team director to discuss case.  He was already aware of client and will assist in getting client appropriately placed, if possible.

## 2016-10-03 ENCOUNTER — Encounter (HOSPITAL_COMMUNITY): Payer: Self-pay

## 2016-10-03 DIAGNOSIS — F17210 Nicotine dependence, cigarettes, uncomplicated: Secondary | ICD-10-CM | POA: Insufficient documentation

## 2016-10-03 DIAGNOSIS — R51 Headache: Secondary | ICD-10-CM | POA: Insufficient documentation

## 2016-10-03 NOTE — ED Triage Notes (Signed)
Pt endorses migraine headache x 3 weeks since coming off of haldol and lithium. VSS. No neuro sx. Pt has autism.

## 2016-10-04 ENCOUNTER — Emergency Department (HOSPITAL_COMMUNITY)
Admission: EM | Admit: 2016-10-04 | Discharge: 2016-10-04 | Disposition: A | Discharge status: Home / Self Care | Payer: Medicaid Other | Attending: Emergency Medicine | Admitting: Emergency Medicine

## 2016-10-04 DIAGNOSIS — R51 Headache: Secondary | ICD-10-CM

## 2016-10-04 DIAGNOSIS — R519 Headache, unspecified: Secondary | ICD-10-CM

## 2016-10-04 MED ORDER — SODIUM CHLORIDE 0.9 % IV BOLUS (SEPSIS)
1000.0000 mL | Freq: Once | INTRAVENOUS | Status: AC
  Administered 2016-10-04: 1000 mL via INTRAVENOUS

## 2016-10-04 MED ORDER — PROCHLORPERAZINE EDISYLATE 5 MG/ML IJ SOLN
10.0000 mg | Freq: Once | INTRAMUSCULAR | Status: AC
  Administered 2016-10-04: 10 mg via INTRAVENOUS
  Filled 2016-10-04: qty 2

## 2016-10-04 MED ORDER — KETOROLAC TROMETHAMINE 30 MG/ML IJ SOLN
30.0000 mg | Freq: Once | INTRAMUSCULAR | Status: AC
  Administered 2016-10-04: 30 mg via INTRAVENOUS
  Filled 2016-10-04: qty 1

## 2016-10-04 MED ORDER — METOCLOPRAMIDE HCL 5 MG/ML IJ SOLN
10.0000 mg | Freq: Once | INTRAMUSCULAR | Status: DC
  Filled 2016-10-04: qty 2

## 2016-10-04 MED ORDER — DIPHENHYDRAMINE HCL 50 MG/ML IJ SOLN
12.5000 mg | Freq: Once | INTRAMUSCULAR | Status: AC
  Administered 2016-10-04: 12.5 mg via INTRAVENOUS
  Filled 2016-10-04: qty 1

## 2016-10-04 NOTE — ED Notes (Signed)
Pt verbalized understanding of d/c instructions and has no further questions. Pt is stable, A&Ox4, VSS.  

## 2016-10-04 NOTE — ED Provider Notes (Signed)
[facility]-EMER[facility]ENCY DEPT Provider Note   CSN: [id number] Arrival date & time: 10/03/16  2338     History   Chief Complaint Chief Complaint  Patient presents with  . Headache    HPI [name] is a 21 y.o. male.  The history is provided by the patient and medical records. No language interpreter was used.  Headache    [name] is a 21 y.o. male  with a PMH of autism who presents to the Emergency Department complaining of intermittent throbbing generalized headache off-and-on over the last three weeks. Took an aspirin prior to arrival which somewhat relief pain, but pain has returned. Bright lights and loud sounds make it worse. No n/v or abdominal pain. No fever, chills, neck pain, back pain, dizziness, tinnitus, cough, congestion.  Past Medical History:  Diagnosis Date  . Autism   . Seizures (HCC)    none since 21 years old    There are no active problems to display for this patient.   Past Surgical History:  Procedure Laterality Date  . TOOTH EXTRACTION         Home Medications    Prior to Admission medications   Medication Sig Start Date End Date Taking? Authorizing Provider  erythromycin ophthalmic ointment Place 1 application into the left eye every 6 (six) hours. Left eye irritation Patient not taking: Reported on 03/29/2016 12/05/15   Adonis BrookAgustin, Sheila, NP  lithium carbonate 300 MG capsule Take 1 capsule (300 mg total) by mouth 2 (two) times daily with a meal. Patient not taking: Reported on 10/04/2016 12/05/15   Adonis BrookAgustin, Sheila, NP    Family History History reviewed. No pertinent family history.  Social History Social History  Substance Use Topics  . Smoking status: Current Some Day Smoker    Packs/day: 1.00    Types: Cigarettes  . Smokeless tobacco: Never Used  . Alcohol use Yes     Comment: socially     Allergies   Other   Review of Systems Review of Systems  Eyes: Positive for photophobia.  Neurological: Positive for headaches.  All  other systems reviewed and are negative.    Physical Exam Updated Vital Signs BP 112/60   Pulse 73   Temp 98.3 F (36.8 C) (Oral)   Resp 16   Ht 5\' 9"  (1.753 m)   Wt 77.1 kg (170 lb)   SpO2 98%   BMI 25.10 kg/m   Physical Exam  Constitutional: He is oriented to person, place, and time. He appears well-developed and well-nourished. No distress.  HENT:  Head: Normocephalic and atraumatic.  Mouth/Throat: Oropharynx is clear and moist.  No tenderness of the temporal artery   Eyes: Conjunctivae and EOM are normal. Pupils are equal, round, and reactive to light. No scleral icterus.  No nystagmus   Neck: Normal range of motion. Neck supple.  Full active and passive ROM without pain.  No midline or paraspinal tenderness. No nuchal rigidity or meningeal signs.  Cardiovascular: Normal rate, regular rhythm, normal heart sounds and intact distal pulses.   Pulmonary/Chest: Effort normal and breath sounds normal. No respiratory distress. He has no wheezes. He has no rales.  Abdominal: Soft. Bowel sounds are normal. There is no tenderness. There is no rebound and no guarding.  Musculoskeletal: Normal range of motion.  Lymphadenopathy:    He has no cervical adenopathy.  Neurological: He is alert and oriented to person, place, and time. He has normal reflexes. No cranial nerve deficit. Coordination normal.  Alert, oriented, thought content  appropriate, able to give a coherent history. Speech is clear and goal oriented, able to follow commands.  Cranial Nerves:  II:  Peripheral visual fields grossly normal, pupils equal, round, reactive to light III, IV, VI: EOM intact bilaterally, ptosis not present V,VII: smile symmetric, eyes kept closed tightly against resistance, facial light touch sensation equal VIII: hearing grossly normal IX, X: symmetric soft palate movement, uvula elevates symmetrically  XI: bilateral shoulder shrug symmetric and strong XII: midline tongue extension 5/5 muscle  strength in upper and lower extremities bilaterally including strong and equal grip strength and dorsiflexion/plantar flexion Sensory to light touch normal in all four extremities.  Normal finger-to-nose and rapid alternating movements; normal gait and balance. No drift.  Skin: Skin is warm and dry. No rash noted. He is not diaphoretic.  Psychiatric: He has a normal mood and affect. His behavior is normal. Judgment and thought content normal.  Nursing note and vitals reviewed.    ED Treatments / Results  Labs (all labs ordered are listed, but only abnormal results are displayed) Labs Reviewed - No data to display  EKG  EKG Interpretation None       Radiology No results found.  Procedures Procedures (including critical care time)  Medications Ordered in ED Medications  sodium chloride 0.9 % bolus 1,000 mL (0 mLs Intravenous Stopped 10/04/16 0522)  prochlorperazine (COMPAZINE) injection 10 mg (10 mg Intravenous Given 10/04/16 0330)  diphenhydrAMINE (BENADRYL) injection 12.5 mg (12.5 mg Intravenous Given 10/04/16 0330)  ketorolac (TORADOL) 30 MG/ML injection 30 mg (30 mg Intravenous Given 10/04/16 0330)     Initial Impression / Assessment and Plan / ED Course  I have reviewed the triage vital signs and the nursing notes.  Pertinent labs & imaging results that were available during my care of the patient were reviewed by me and considered in my medical decision making (see chart for details).  [name] is a 21 y.o. male who presents to ED for headache No focal neuro deficits on exam. Migraine cocktail and fluids given. On re-evaluation, patient feels improved. Headache resolved. The patient denies any neurologic symptoms such as visual changes, focal numbness/weakness, balance problems, confusion, or speech difficulty to suggest a life-threatening intracranial process such as intracranial hemorrhage or mass. The patient has no clotting risk factors thus venous sinus thrombosis  is unlikely. No fevers, neck pain or nuchal rigidity to suggest meningitis. I feel that the patient is safe for discharge home at this time. PCP follow up strongly encouraged. I have reviewed return precautions including development of neurologic symptoms, confusion, lethargy, difficulty speaking, or new/worsening/concerning symptoms. All questions answered.   Final Clinical Impressions(s) / ED Diagnoses   Final diagnoses:  Bad headache    New Prescriptions Discharge Medication List as of 10/04/2016  4:58 AM       [name], Chase Picket, PA-C 10/04/16 4098    Derwood Kaplan, MD 10/04/16 2315

## 2016-10-04 NOTE — Discharge Instructions (Signed)
It was my pleasure taking care of you today!  Drink plenty of fluids at home. This will help with your headache.  Please follow up with your primary care doctor in regards to today's hospital visit. Call them today to schedule an appointment.   Fortunately, your evaluation today is reassuring with no apparent emergent cause for your headache at this time. With that being said, it is VERY important that you monitor your symptoms at home. If you develop worsening headache, new fever, new neck stiffness, rash, weakness, numbness, trouble with your speech, trouble walking, new or worsening symptoms or any concerning symptoms, please return to the ED immediately.

## 2016-10-20 ENCOUNTER — Emergency Department (HOSPITAL_COMMUNITY)
Admission: EM | Admit: 2016-10-20 | Discharge: 2016-10-21 | Disposition: A | Discharge status: Home / Self Care | Payer: Medicaid Other | Source: Home / Self Care | Attending: Emergency Medicine | Admitting: Emergency Medicine

## 2016-10-20 ENCOUNTER — Encounter (HOSPITAL_COMMUNITY): Payer: Self-pay | Admitting: *Deleted

## 2016-10-20 ENCOUNTER — Emergency Department (HOSPITAL_COMMUNITY)
Admission: EM | Admit: 2016-10-20 | Discharge: 2016-10-20 | Disposition: A | Discharge status: Home / Self Care | Payer: Medicaid Other | Attending: Emergency Medicine | Admitting: Emergency Medicine

## 2016-10-20 ENCOUNTER — Encounter (HOSPITAL_COMMUNITY): Payer: Self-pay | Admitting: Emergency Medicine

## 2016-10-20 DIAGNOSIS — Z5900 Homelessness unspecified: Secondary | ICD-10-CM

## 2016-10-20 DIAGNOSIS — F17210 Nicotine dependence, cigarettes, uncomplicated: Secondary | ICD-10-CM | POA: Diagnosis not present

## 2016-10-20 DIAGNOSIS — R51 Headache: Secondary | ICD-10-CM | POA: Diagnosis present

## 2016-10-20 DIAGNOSIS — R110 Nausea: Secondary | ICD-10-CM

## 2016-10-20 DIAGNOSIS — R45851 Suicidal ideations: Secondary | ICD-10-CM | POA: Diagnosis not present

## 2016-10-20 LAB — ETHANOL: Alcohol, Ethyl (B): <5 mg/dL (ref <5)

## 2016-10-20 LAB — COMPREHENSIVE METABOLIC PANEL
ALBUMIN: 3.7 g/dL (ref 3.5–5.0)
ALK PHOS: 79 U/L (ref 38–126)
ALT: 16 U/L — AB (ref 17–63)
AST: 15 U/L (ref 15–41)
Anion gap: 12 (ref 5–15)
BILIRUBIN TOTAL: 0.6 mg/dL (ref 0.3–1.2)
BUN: 14 mg/dL (ref 6–20)
CALCIUM: 9.2 mg/dL (ref 8.9–10.3)
CO2: 24 mmol/L (ref 22–32)
CREATININE: 0.83 mg/dL (ref 0.61–1.24)
Chloride: 102 mmol/L (ref 101–111)
GFR calc Af Amer: >60 mL/min (ref >60)
GFR calc non Af Amer: >60 mL/min (ref >60)
GLUCOSE: 93 mg/dL (ref 65–99)
Potassium: 3.3 mmol/L — ABNORMAL LOW (ref 3.5–5.1)
Sodium: 138 mmol/L (ref 135–145)
TOTAL PROTEIN: 7.6 g/dL (ref 6.5–8.1)

## 2016-10-20 LAB — CBC
HCT: 37.1 % — ABNORMAL LOW (ref 39.0–52.0)
Hemoglobin: 12.9 g/dL — ABNORMAL LOW (ref 13.0–17.0)
MCH: 29.6 pg (ref 26.0–34.0)
MCHC: 34.8 g/dL (ref 30.0–36.0)
MCV: 85.1 fL (ref 78.0–100.0)
PLATELETS: 267 10*3/uL (ref 150–400)
RBC: 4.36 MIL/uL (ref 4.22–5.81)
RDW: 12.3 % (ref 11.5–15.5)
WBC: 13.1 10*3/uL — ABNORMAL HIGH (ref 4.0–10.5)

## 2016-10-20 LAB — RAPID URINE DRUG SCREEN, HOSP PERFORMED
Amphetamines: NOT DETECTED
Barbiturates: NOT DETECTED
Benzodiazepines: NOT DETECTED
Cocaine: NOT DETECTED
OPIATES: NOT DETECTED
Tetrahydrocannabinol: POSITIVE — AB

## 2016-10-20 MED ORDER — ONDANSETRON 4 MG PO TBDP
ORAL_TABLET | ORAL | Status: DC
Start: 2016-10-20 — End: 2016-10-21
  Filled 2016-10-20: qty 2

## 2016-10-20 MED ORDER — ACETAMINOPHEN 325 MG PO TABS
650.0000 mg | ORAL_TABLET | Freq: Once | ORAL | Status: AC
  Administered 2016-10-20: 650 mg via ORAL
  Filled 2016-10-20: qty 2

## 2016-10-20 MED ORDER — POTASSIUM CHLORIDE CRYS ER 10 MEQ PO TBCR
10.0000 meq | EXTENDED_RELEASE_TABLET | Freq: Once | ORAL | Status: DC
  Filled 2016-10-20: qty 1

## 2016-10-20 MED ORDER — ONDANSETRON 4 MG PO TBDP
8.0000 mg | ORAL_TABLET | Freq: Once | ORAL | Status: AC
  Administered 2016-10-20: 8 mg via ORAL

## 2016-10-20 MED ORDER — POTASSIUM CHLORIDE CRYS ER 20 MEQ PO TBCR
40.0000 meq | EXTENDED_RELEASE_TABLET | Freq: Once | ORAL | Status: AC
  Administered 2016-10-20: 40 meq via ORAL
  Filled 2016-10-20 (×2): qty 2

## 2016-10-20 MED ORDER — AMMONIA AROMATIC IN INHA
RESPIRATORY_TRACT | Status: AC
  Filled 2016-10-20: qty 10

## 2016-10-20 NOTE — Progress Notes (Signed)
CSW consulted regarding homeless issues for pt. CSW met with pt at bedside to discuss what problems pt is currently dealing with. Pt informed CSW that pt is homeless and once lived with pt's sister. Pt also informed CSW that pt has previously been in rehab and tried to return but was unable to. Pt mentioned that pt only stayed with pt's sister on the weekends. CSW asked for permission to contact pt's sister to get further information regarding pt's living situation. CSW contacted pt's sister and didn't receive an answer. A voicemail was left instructing pt's sister to call back. CSW will continue to follow up with the needs of pt.      [name], MSW, [place] Emergency Department Clinical Social Worker [phone]

## 2016-10-20 NOTE — ED Provider Notes (Signed)
[facility]PT Provider Note   CSN: [id number] Arrival date & time: 10/20/16  2247     History   Chief Complaint Chief Complaint  Patient presents with  . Nausea    HPI [name] is a 21 y.o. male.  Patient states that he is here for chronic headaches.  He knows.  His potassium is low as the nurse at triage told him it was low and he is concerned about pot and he has some nausea.  He states that the nausea pills make his nausea worse.  He's not had any vomiting or diarrhea.  He states he just can't stand being out in the heat. Patient was seen and evaluated by social work.  [facility] and was given a list of homeless  shelters      Past Medical History:  Diagnosis Date  . Autism   . Seizures (HCC)    none since 21 years old    There are no active problems to display for this patient.   Past Surgical History:  Procedure Laterality Date  . TOOTH EXTRACTION         Home Medications    Prior to Admission medications   Medication Sig Start Date End Date Taking? Authorizing Provider  erythromycin ophthalmic ointment Place 1 application into the left eye every 6 (six) hours. Left eye irritation Patient not taking: Reported on 03/29/2016 12/05/15   Adonis BrookAgustin, Sheila, NP  lithium carbonate 300 MG capsule Take 1 capsule (300 mg total) by mouth 2 (two) times daily with a meal. Patient not taking: Reported on 10/04/2016 12/05/15   Adonis BrookAgustin, Sheila, NP    Family History No family history on file.  Social History Social History  Substance Use Topics  . Smoking status: Current Some Day Smoker    Packs/day: 1.00    Types: Cigarettes  . Smokeless tobacco: Never Used  . Alcohol use Yes     Comment: socially     Allergies   Other   Review of Systems Review of Systems  Constitutional: Negative for fever.  Gastrointestinal: Positive for nausea. Negative for vomiting.  Neurological: Positive for headaches.  All other systems  reviewed and are negative.    Physical Exam Updated Vital Signs BP 118/76   Pulse 80   Temp 98.5 F (36.9 C) (Oral)   Resp 18   Ht 5\' 9"  (1.753 m) Comment: Simultaneous filing. User may not have seen previous data.  Wt 70.8 kg (156 lb) Comment: Simultaneous filing. User may not have seen previous data.  SpO2 98%   BMI 23.04 kg/m   Physical Exam  Constitutional: He appears well-developed and well-nourished.  HENT:  Head: Normocephalic.  Eyes: Pupils are equal, round, and reactive to light.  Neck: Normal range of motion.  Cardiovascular: Normal rate.   Pulmonary/Chest: Effort normal.  Musculoskeletal: Normal range of motion.  Neurological: He is alert.  Skin: Skin is warm.  Psychiatric: He has a normal mood and affect.  Nursing note and vitals reviewed.    ED Treatments / Results  Labs (all labs ordered are listed, but only abnormal results are displayed) Labs Reviewed  COMPREHENSIVE METABOLIC PANEL - Abnormal; Notable for the following:       Result Value   AST 14 (*)    ALT 16 (*)    All other components within normal limits  CBC WITH DIFFERENTIAL/PLATELET    EKG  EKG Interpretation None       Radiology No results  found.  Procedures Procedures (including critical care time)  Medications Ordered in ED Medications  ondansetron (ZOFRAN-ODT) disintegrating tablet 8 mg (8 mg Oral Given 10/20/16 2258)     Initial Impression / Assessment and Plan / ED Course  I have reviewed the triage vital signs and the nursing notes.  Pertinent labs & imaging results that were available during my care of the patient were reviewed by me and considered in my medical decision making (see chart for details).      Patient is refusing to take her potassium supplement.  He is refusing to drink orange juice.  He states that he is going to die because his potassium is 3.3 and he needs to stay in the hospital because he has chronic daily headaches He is homeless and he has not  made any arrangements even though he was given a list of shelters .  This morning he states he was talking to his case manager and he may go into another group home, but is unsure of that as well. Final Clinical Impressions(s) / ED Diagnoses   Final diagnoses:  Nausea  Homelessness    New Prescriptions Discharge Medication List as of 10/21/2016 12:01 AM       Earley Favor, NP 10/21/16 0001    Earley Favor, NP 10/21/16 2956    Devoria Albe, MD 10/21/16 [phone]

## 2016-10-20 NOTE — ED Notes (Signed)
Pt discharged to community.  Pt states he will go to [facility] to see case manager.  Bus pass given.  Stable.  Discussed discharge paperwork.  Pt verbalized understanding.  No family or friends at bedside.  Pt in calm, pleasant mood upon discharge.    Barrie LymeVance, Luisalberto Beegle E RN 11:51 AM 10/20/2016

## 2016-10-20 NOTE — ED Notes (Signed)
Pt now stating "[facility] my friend.  The police gave me the option of going downtown or coming here.  I just want to be with Theo.  We're like family.  I don't have a place to stay tonight."  Pt is refusing to change into scrubs, have labs drawn or provide urine.

## 2016-10-20 NOTE — ED Notes (Signed)
[facility]ge [facility] to [facility]  [employer] who transported pt @ [facility].

## 2016-10-20 NOTE — ED Triage Notes (Signed)
Patient reports nausea onset this evening , denies diarrhea/ no fever or chills .

## 2016-10-20 NOTE — Progress Notes (Signed)
CSW provided RN with buss pass for pt once ready to discharge.    Claude MangesKierra S. Kae Lauman, MSW, LCSW-A Emergency Department Clinical Social Worker [phone]

## 2016-10-20 NOTE — Progress Notes (Signed)
CSW was asked by [facility] Care Coordinator to have pt contacted her [name]([name]). CSW provided pt with a sticky note that read " Andrew KollerKarin [name] [facility] Care Coordinator! [phone]". Pt was told that she wanted pt to contact her. Pt shook head as in understanding.    Andrew MangesKierra S. Barby [name], MSW, LCSW-A Emergency Department Clinical Social Worker [phone]

## 2016-10-20 NOTE — Progress Notes (Signed)
CSW provided pt with homeless shelter resources at bedside. Pt didn't present any further questions or concerns at this time. CSW notified pt's doctor and nurse that pt had been given the needed resources from CSW.    Claude MangesKierra S. Heba Ige, MSW, LCSW-A Emergency Department Clinical Social Worker [phone]

## 2016-10-20 NOTE — Progress Notes (Addendum)
CSW contacted [facility] Care Coordinator Elizebeth Koller( Karin McClelland). Care coordinator informed CSW that she has worked with pt in the past but hasn't had any recent contact with pt. Care coordinator informed CSW that pt [place] lived [place]. Care coordinator also informed CSW that pt has had services provided to [facility] by [facility] and [facility] . Care coordinator asked to speak with pt or have pt contact her so that she can just check on him and see what she can assist him with. CSW was made aware that pt is pt's own legal guardian, however pt's sister is pt's payee so that could issues related to pt not staying there. Lucienne MinksKarin encouraged CSW to contact [facility] at [facility] to receive further information on pt and [facility].  CSW spoke with Alex [phone] at [facility] [place]. Alex informed CSW that he has worked with pt in the past and enrolled pt in the program as of 10/03/2016, but pt has not [facility] since then. CSW will provide pt with homeless shelter list at this time and assist with further discharge needs.    Claude MangesKierra S. Dominique Ressel, MSW, LCSW-A Emergency Department Clinical Social Worker [phone]

## 2016-10-20 NOTE — Discharge Instructions (Signed)
Shelter, or [facility] as per MSW notes.

## 2016-10-20 NOTE — ED Notes (Signed)
Pt stated "I have chronic h/a's but my doctor won't do anything about it."

## 2016-10-20 NOTE — ED Triage Notes (Signed)
Pt stated "I [facility] to go [facility] to be with my friend, Danice Goltzheo @ [facility].  My sister wouldn't let me stay with her because she has to go to work in the morning.  I was in rehab and was supposed to go back Monday but they wouldn't let me in.  I went there for substance abuse, marijuana.  I drink whenever I can.  My sister has plugs that she gets all kind of weed from, [place], [place] weed.  Last week I bought me some loud and mid over on [facility]."

## 2016-10-20 NOTE — ED Provider Notes (Addendum)
[facility]-EMERGENCY DEPT Provider Note   CSN: [id number] Arrival date & time: 10/20/16  0257     History   Chief Complaint Chief Complaint  Patient presents with  . Medical Clearance    HPI [name] is a 21 y.o. male. CC: [place]. Suicidal statement. Homeless.  HPI: 21y/o male with history of autism and sz disorder.   The patient used to live with his sister but does not anymore. 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 Not recent. He states that he made a suicidal statement to police last night but is not suicidal. He states that he needs help trying to find a place to live and wants to "go to a group home".  Past Medical History:  Diagnosis Date  . Autism   . Seizures (HCC)    none since 21 years old    There are no active problems to display for this patient.   Past Surgical History:  Procedure Laterality Date  . TOOTH EXTRACTION         Home Medications    Prior to Admission medications   Medication Sig Start Date End Date Taking? Authorizing Provider  erythromycin ophthalmic ointment Place 1 application into the left eye every 6 (six) hours. Left eye irritation Patient not taking: Reported on 03/29/2016 12/05/15   Adonis BrookAgustin, Sheila, NP  lithium carbonate 300 MG capsule Take 1 capsule (300 mg total) by mouth 2 (two) times daily with a meal. Patient not taking: Reported on 10/04/2016 12/05/15   Adonis BrookAgustin, Sheila, NP    Family History No family history on file.  Social History Social History  Substance Use Topics  . Smoking status: Current Some Day Smoker    Packs/day: 1.00    Types: Cigarettes  . Smokeless tobacco: Never Used  . Alcohol use Yes     Comment: socially     Allergies   Other   Review of Systems Review of Systems  Constitutional: Negative for appetite change, chills, diaphoresis, fatigue and fever.  HENT: Negative for mouth sores, sore throat and trouble swallowing.   Eyes: Negative for  visual disturbance.  Respiratory: Negative for cough, chest tightness, shortness of breath and wheezing.   Cardiovascular: Negative for chest pain.  Gastrointestinal: Negative for abdominal distention, abdominal pain, diarrhea, nausea and vomiting.  Endocrine: Negative for polydipsia, polyphagia and polyuria.  Genitourinary: Negative for dysuria, frequency and hematuria.  Musculoskeletal: Negative for gait problem.  Skin: Negative for color change, pallor and rash.  Neurological: Positive for headaches. Negative for dizziness, syncope and light-headedness.  Hematological: Does not bruise/bleed easily.  Psychiatric/Behavioral: Negative for behavioral problems and confusion.     Physical Exam Updated Vital Signs BP (!) 90/46 (BP Location: Left Arm)   Pulse 60   Temp 98 F (36.7 C) (Oral)   Resp 16   Ht 5\' 9"  (1.753 m)   Wt 77.1 kg (170 lb)   SpO2 98%   BMI 25.10 kg/m   Physical Exam  Constitutional: He is oriented to person, place, and time. He appears well-developed and well-nourished. No distress.  Sleeping. Awakens with moderate stimulation--seems intentional.  HENT:  Head: Normocephalic.  Eyes: Conjunctivae are normal. Pupils are equal, round, and reactive to light. No scleral icterus.  Neck: Normal range of motion. Neck supple. No thyromegaly present.  Cardiovascular: Normal rate and regular rhythm.  Exam reveals no gallop and no friction rub.   No murmur heard. Pulmonary/Chest: Effort normal and breath sounds normal. No  respiratory distress. He has no wheezes. He has no rales.  Abdominal: Soft. Bowel sounds are normal. He exhibits no distension. There is no tenderness. There is no rebound.  Musculoskeletal: Normal range of motion.  Neurological: He is alert and oriented to person, place, and time.  Skin: Skin is warm and dry. No rash noted.  Psychiatric: He has a normal mood and affect. His behavior is normal.     ED Treatments / Results  Labs (all labs ordered are  listed, but only abnormal results are displayed) Labs Reviewed  COMPREHENSIVE METABOLIC PANEL - Abnormal; Notable for the following:       Result Value   Potassium 3.3 (*)    ALT 16 (*)    All other components within normal limits  CBC - Abnormal; Notable for the following:    WBC 13.1 (*)    Hemoglobin 12.9 (*)    HCT 37.1 (*)    All other components within normal limits  RAPID URINE DRUG SCREEN, HOSP PERFORMED - Abnormal; Notable for the following:    Tetrahydrocannabinol POSITIVE (*)    All other components within normal limits  ETHANOL    EKG  EKG Interpretation None       Radiology No results found.  Procedures Procedures (including critical care time)  Medications Ordered in ED Medications  ammonia inhalant (not administered)     Initial Impression / Assessment and Plan / ED Course  I have reviewed the triage vital signs and the nursing notes.  Pertinent labs & imaging results that were available during my care of the patient were reviewed by me and considered in my medical decision making (see chart for details).     Denying being suicidal currently. He is chronically sober. Less social worker for evaluation regarding his living situation and needs  Final Clinical Impressions(s) / ED Diagnoses   Final diagnoses:  Homeless   Patient evaluated and given multiple resources by medical social work.  New Prescriptions New Prescriptions   No medications on file     Rolland Porter, MD 10/20/16 9629    Rolland Porter, MD 10/20/16 [phone]

## 2016-10-21 LAB — COMPREHENSIVE METABOLIC PANEL
ALBUMIN: 3.6 g/dL (ref 3.5–5.0)
ALK PHOS: 84 U/L (ref 38–126)
ALT: 16 U/L — ABNORMAL LOW (ref 17–63)
ANION GAP: 8 (ref 5–15)
AST: 14 U/L — ABNORMAL LOW (ref 15–41)
BILIRUBIN TOTAL: 0.5 mg/dL (ref 0.3–1.2)
BUN: 11 mg/dL (ref 6–20)
CALCIUM: 9.4 mg/dL (ref 8.9–10.3)
CO2: 24 mmol/L (ref 22–32)
Chloride: 106 mmol/L (ref 101–111)
Creatinine, Ser: 0.73 mg/dL (ref 0.61–1.24)
GFR calc Af Amer: >60 mL/min (ref >60)
GLUCOSE: 88 mg/dL (ref 65–99)
Potassium: 3.9 mmol/L (ref 3.5–5.1)
Sodium: 138 mmol/L (ref 135–145)
TOTAL PROTEIN: 7.2 g/dL (ref 6.5–8.1)

## 2016-10-21 LAB — CBC WITH DIFFERENTIAL/PLATELET
BASOS PCT: 0 %
Basophils Absolute: 0 10*3/uL (ref 0.0–0.1)
Eosinophils Absolute: 0.2 10*3/uL (ref 0.0–0.7)
Eosinophils Relative: 2 %
HEMATOCRIT: 40.2 % (ref 39.0–52.0)
HEMOGLOBIN: 13.2 g/dL (ref 13.0–17.0)
LYMPHS ABS: 2.7 10*3/uL (ref 0.7–4.0)
LYMPHS PCT: 29 %
MCH: 28.7 pg (ref 26.0–34.0)
MCHC: 32.8 g/dL (ref 30.0–36.0)
MCV: 87.4 fL (ref 78.0–100.0)
MONO ABS: 0.7 10*3/uL (ref 0.1–1.0)
MONOS PCT: 8 %
NEUTROS ABS: 5.4 10*3/uL (ref 1.7–7.7)
Neutrophils Relative %: 61 %
Platelets: 265 10*3/uL (ref 150–400)
RBC: 4.6 MIL/uL (ref 4.22–5.81)
RDW: 12.6 % (ref 11.5–15.5)
WBC: 9.1 10*3/uL (ref 4.0–10.5)

## 2016-10-21 NOTE — Discharge Instructions (Signed)
Follow-up with the caseworker in the morning Since she will not take it does seem supplement or drink orange juice you have been given a list of foods that have higher potassium contents.  Please try to include this in your diet

## 2016-10-21 NOTE — ED Notes (Signed)
Pt refusing to leave, given discharge instructions but pt refused treatment. Pt has resources for group homes and did not fill out any paperwork
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
## 2016-12-10 ENCOUNTER — Encounter (HOSPITAL_COMMUNITY): Payer: Self-pay

## 2016-12-10 ENCOUNTER — Emergency Department (HOSPITAL_COMMUNITY)
Admission: EM | Admit: 2016-12-10 | Discharge: 2016-12-10 | Disposition: A | Discharge status: Home / Self Care | Payer: Medicaid Other | Attending: Emergency Medicine | Admitting: Emergency Medicine

## 2016-12-10 DIAGNOSIS — F17210 Nicotine dependence, cigarettes, uncomplicated: Secondary | ICD-10-CM | POA: Diagnosis not present

## 2016-12-10 DIAGNOSIS — J069 Acute upper respiratory infection, unspecified: Secondary | ICD-10-CM | POA: Insufficient documentation

## 2016-12-10 DIAGNOSIS — F840 Autistic disorder: Secondary | ICD-10-CM | POA: Insufficient documentation

## 2016-12-10 DIAGNOSIS — B9789 Other viral agents as the cause of diseases classified elsewhere: Secondary | ICD-10-CM

## 2016-12-10 DIAGNOSIS — R05 Cough: Secondary | ICD-10-CM | POA: Diagnosis present

## 2016-12-10 MED ORDER — FLUTICASONE PROPIONATE 50 MCG/ACT NA SUSP: 1.0000 | Freq: Every day | NASAL | 2 refills | Status: DC

## 2016-12-10 MED ORDER — BENZONATATE 100 MG PO CAPS: 100.0000 mg | ORAL_CAPSULE | Freq: Three times a day (TID) | ORAL | 0 refills | Status: DC

## 2016-12-10 NOTE — ED Notes (Signed)
PT states understanding of care given, follow up care, and medication prescribed. PT ambulated from ED to car with a steady gait. 

## 2016-12-10 NOTE — Discharge Instructions (Signed)
It is important to stay well-hydrated with water. Used [employer] as needed for cough. Use Flonase daily to help with nasal congestion. Use Tylenol or ibuprofen as needed for fever or chills. Make sure you wash your hands frequently to prevent spread of infection. Follow-up with [facility] in 7-10 days if symptoms persist.  Return to the emergency department if you develop persistent high fevers, chest pain, shortness of breath, difficulty breathing, or any new or worsening symptoms.

## 2016-12-10 NOTE — ED Triage Notes (Signed)
Onset 2 days productive cough-white mucus, difficulty sleeping, nasal congestion.  Talking in complete sentences.  NAD in triage.

## 2016-12-10 NOTE — ED Provider Notes (Signed)
[facility]-EMERGENCY DEPT Provider Note   CSN: [id number] Arrival date & time: 12/10/16  1749     History   Chief Complaint Chief Complaint  Patient presents with  . Cough    HPI [name] is a 21 y.o. male presenting with a 2 day history of cough and congestion.   Patient states that he started developing symptoms 2 days ago. He reports a productive cough with clear sputum and nasal congestion. He states 2 other family members have similar symptoms. He has not taken anything for his symptoms so far. Nothing has made it better. He denies fevers, chills, ear pain, eye pain, frontal pressure, sore throat, chest pain, difficulty breathing, nausea, vomiting, or abdominal pain. He is not meter compromise. He denies difficulty handling his secretions or pain with swallowing.   HPI  Past Medical History:  Diagnosis Date  . Autism   . Seizures (HCC)    none since 21 years old    There are no active problems to display for this patient.   Past Surgical History:  Procedure Laterality Date  . TOOTH EXTRACTION         Home Medications    Prior to Admission medications   Medication Sig Start Date End Date Taking? Authorizing Provider  benzonatate (TESSALON) 100 MG capsule Take 1 capsule (100 mg total) by mouth every 8 (eight) hours. 12/10/16   Ranette Luckadoo, PA-C  erythromycin ophthalmic ointment Place 1 application into the left eye every 6 (six) hours. Left eye irritation Patient not taking: Reported on 03/29/2016 12/05/15   Adonis Brook, NP  fluticasone [facility]) 50 MCG/ACT nasal spray Place 1 spray into both nostrils daily. 12/10/16   Caelum Federici, PA-C  lithium carbonate 300 MG capsule Take 1 capsule (300 mg total) by mouth 2 (two) times daily with a meal. Patient not taking: Reported on 10/04/2016 12/05/15   Adonis Brook, NP    Family History History reviewed. No pertinent family history.  Social History Social History  Substance Use Topics  . Smoking  status: Current Some Day Smoker    Packs/day: 1.00    Types: Cigarettes  . Smokeless tobacco: Never Used  . Alcohol use No     Allergies   Other   Review of Systems Review of Systems  Constitutional: Negative for chills and fever.  HENT: Positive for congestion and rhinorrhea. Negative for ear pain, facial swelling, sinus pain, sinus pressure, sore throat, trouble swallowing and voice change.   Eyes: Negative for pain.  Respiratory: Positive for cough. Negative for chest tightness, shortness of breath and wheezing.   Cardiovascular: Negative for chest pain.  Gastrointestinal: Negative for abdominal pain, constipation, diarrhea, nausea and vomiting.  Allergic/Immunologic: Negative for immunocompromised state.     Physical Exam Updated Vital Signs BP 110/70 (BP Location: Right Arm)   Pulse 73   Temp 98.1 F (36.7 C) (Oral)   Resp 16   SpO2 100%   Physical Exam  Constitutional: He is oriented to person, place, and time. He appears well-developed and well-nourished. No distress.  HENT:  Head: Normocephalic and atraumatic.  Right Ear: Tympanic membrane, external ear and ear canal normal.  Left Ear: Tympanic membrane, external ear and ear canal normal.  Nose: Mucosal edema and rhinorrhea present. Right sinus exhibits no maxillary sinus tenderness and no frontal sinus tenderness. Left sinus exhibits no maxillary sinus tenderness and no frontal sinus tenderness.  Mouth/Throat: Uvula is midline and mucous membranes are normal. No tonsillar exudate.  Cobblestoning of OP. No erythema  or exudate. No signs of of abscess. No tonsillar swelling.  Eyes: EOM are normal.  Neck: Normal range of motion.  Cardiovascular: Normal rate, regular rhythm and intact distal pulses.   Pulmonary/Chest: Effort normal and breath sounds normal. No respiratory distress. He has no decreased breath sounds. He has no wheezes. He has no rhonchi. He has no rales.  Clear lung sounds in all fields.  Abdominal:  He exhibits no distension.  Musculoskeletal: Normal range of motion.  Lymphadenopathy:    He has no cervical adenopathy.  Neurological: He is alert and oriented to person, place, and time.  Skin: Skin is warm. No rash noted.  Psychiatric: He has a normal mood and affect.  Nursing note and vitals reviewed.    ED Treatments / Results  Labs (all labs ordered are listed, but only abnormal results are displayed) Labs Reviewed - No data to display  EKG  EKG Interpretation None       Radiology No results found.  Procedures Procedures (including critical care time)  Medications Ordered in ED Medications - No data to display   Initial Impression / Assessment and Plan / ED Course  I have reviewed the triage vital signs and the nursing notes.  Pertinent labs & imaging results that were available during my care of the patient were reviewed by me and considered in my medical decision making (see chart for details).     Patient presenting with 2 day history of cough and nasal congestion. Physical exam reassuring as patient is afebrile and lung sounds are clear. Doubt pneumonia. No tonsillar exudate or infection of the throat. Likely viral illness. Discussed symptomatically treatment with patient. At this time, patient appears safe for discharge. Patient to follow-up with primary care if symptoms are not improving. Return precautions given. Patient states he understands and agrees to plan.  Final Clinical Impressions(s) / ED Diagnoses   Final diagnoses:  Viral URI with cough    New Prescriptions Discharge Medication List as of 12/10/2016  7:37 PM    START taking these medications   Details  benzonatate (TESSALON) 100 MG capsule Take 1 capsule (100 mg total) by mouth every 8 (eight) hours., Starting Sat 12/10/2016, Print    fluticasone (FLONASE) 50 MCG/ACT nasal spray Place 1 spray into both nostrils daily., Starting Sat 12/10/2016, Print         [place], [place],  PA-C 12/10/16 2043    Rolland Porter, MD 12/20/16 [phone]

## 2017-02-02 ENCOUNTER — Encounter (HOSPITAL_COMMUNITY): Payer: Self-pay | Admitting: Emergency Medicine

## 2017-02-02 ENCOUNTER — Emergency Department (HOSPITAL_COMMUNITY)
Admission: EM | Admit: 2017-02-02 | Discharge: 2017-02-03 | Disposition: A | Discharge status: Home / Self Care | Payer: Medicaid Other | Attending: Emergency Medicine | Admitting: Emergency Medicine

## 2017-02-02 ENCOUNTER — Emergency Department (HOSPITAL_COMMUNITY): Payer: Medicaid Other

## 2017-02-02 DIAGNOSIS — F840 Autistic disorder: Secondary | ICD-10-CM | POA: Insufficient documentation

## 2017-02-02 DIAGNOSIS — S99911A Unspecified injury of right ankle, initial encounter: Secondary | ICD-10-CM | POA: Diagnosis present

## 2017-02-02 DIAGNOSIS — Y9389 Activity, other specified: Secondary | ICD-10-CM | POA: Diagnosis not present

## 2017-02-02 DIAGNOSIS — Z79899 Other long term (current) drug therapy: Secondary | ICD-10-CM | POA: Diagnosis not present

## 2017-02-02 DIAGNOSIS — F17210 Nicotine dependence, cigarettes, uncomplicated: Secondary | ICD-10-CM | POA: Diagnosis not present

## 2017-02-02 DIAGNOSIS — Y929 Unspecified place or not applicable: Secondary | ICD-10-CM | POA: Insufficient documentation

## 2017-02-02 DIAGNOSIS — W1842XA Slipping, tripping and stumbling without falling due to stepping into hole or opening, initial encounter: Secondary | ICD-10-CM | POA: Insufficient documentation

## 2017-02-02 DIAGNOSIS — S93431A Sprain of tibiofibular ligament of right ankle, initial encounter: Secondary | ICD-10-CM | POA: Insufficient documentation

## 2017-02-02 DIAGNOSIS — Y998 Other external cause status: Secondary | ICD-10-CM | POA: Diagnosis not present
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
## 2017-02-03 NOTE — Discharge Instructions (Signed)
Take tylenol if needed for pain. Ice and elevate to reduce swelling.
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
## 2017-06-21 ENCOUNTER — Emergency Department (HOSPITAL_COMMUNITY): Payer: Medicaid Other

## 2017-06-21 ENCOUNTER — Other Ambulatory Visit: Payer: Self-pay

## 2017-06-21 ENCOUNTER — Emergency Department (HOSPITAL_COMMUNITY)
Admission: EM | Admit: 2017-06-21 | Discharge: 2017-06-21 | Disposition: A | Discharge status: Home / Self Care | Payer: Medicaid Other | Attending: Emergency Medicine | Admitting: Emergency Medicine

## 2017-06-21 DIAGNOSIS — F17210 Nicotine dependence, cigarettes, uncomplicated: Secondary | ICD-10-CM | POA: Insufficient documentation

## 2017-06-21 DIAGNOSIS — M79652 Pain in left thigh: Secondary | ICD-10-CM

## 2017-06-21 DIAGNOSIS — Y929 Unspecified place or not applicable: Secondary | ICD-10-CM | POA: Insufficient documentation

## 2017-06-21 DIAGNOSIS — Z79899 Other long term (current) drug therapy: Secondary | ICD-10-CM | POA: Insufficient documentation

## 2017-06-21 DIAGNOSIS — F840 Autistic disorder: Secondary | ICD-10-CM | POA: Diagnosis not present

## 2017-06-21 DIAGNOSIS — Y999 Unspecified external cause status: Secondary | ICD-10-CM | POA: Insufficient documentation

## 2017-06-21 DIAGNOSIS — Y939 Activity, unspecified: Secondary | ICD-10-CM | POA: Diagnosis not present

## 2017-06-21 MED ORDER — IBUPROFEN 400 MG PO TABS
400.0000 mg | ORAL_TABLET | Freq: Once | ORAL | Status: AC
  Administered 2017-06-21: 400 mg via ORAL
  Filled 2017-06-21: qty 1
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
## 2017-06-21 NOTE — ED Notes (Signed)
Patient transported to X-ray 
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
## 2017-06-21 NOTE — Discharge Instructions (Signed)
Rest, Ice intermittently (in the first 24-48 hours), Gentle compression with an Ace wrap, and elevate (Limb above the level of the heart) °  °Take up to 800mg of ibuprofen (that is usually 4 over the counter pills)  3 times a day for 5 days. Take with food. ° °Please follow with your primary care doctor in the next 2 days for a check-up. They must obtain records for further management.  ° °Do not hesitate to return to the Emergency Department for any new, worsening or concerning symptoms.  ° °
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
## 2017-07-13 ENCOUNTER — Encounter (HOSPITAL_COMMUNITY): Payer: Self-pay

## 2017-07-13 ENCOUNTER — Emergency Department (HOSPITAL_COMMUNITY)
Admission: EM | Admit: 2017-07-13 | Discharge: 2017-07-13 | Disposition: A | Discharge status: Home / Self Care | Payer: Medicaid Other | Attending: Physician Assistant | Admitting: Physician Assistant

## 2017-07-13 ENCOUNTER — Other Ambulatory Visit: Payer: Self-pay

## 2017-07-13 DIAGNOSIS — Z79899 Other long term (current) drug therapy: Secondary | ICD-10-CM | POA: Diagnosis not present

## 2017-07-13 DIAGNOSIS — F17210 Nicotine dependence, cigarettes, uncomplicated: Secondary | ICD-10-CM | POA: Insufficient documentation

## 2017-07-13 DIAGNOSIS — H6121 Impacted cerumen, right ear: Secondary | ICD-10-CM | POA: Diagnosis not present

## 2017-07-13 DIAGNOSIS — H9201 Otalgia, right ear: Secondary | ICD-10-CM

## 2017-07-13 MED ORDER — IBUPROFEN 400 MG PO TABS
400.0000 mg | ORAL_TABLET | Freq: Once | ORAL | Status: AC
  Administered 2017-07-13: 400 mg via ORAL
  Filled 2017-07-13: qty 1
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
## 2017-07-13 NOTE — Discharge Instructions (Addendum)
For pain control please take Ibuprofen (also known as Motrin or Advil) 400mg (this is normally 2 over the counter pills) every 6 hours. Take with food to minimize stomach irritation. ° °Please follow with your primary care doctor in the next 2 days for a check-up. They must obtain records for further management.  ° °Do not hesitate to return to the Emergency Department for any new, worsening or concerning symptoms.  ° °
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
## 2017-10-05 ENCOUNTER — Emergency Department (HOSPITAL_COMMUNITY)
Admission: EM | Admit: 2017-10-05 | Discharge: 2017-10-06 | Disposition: A | Discharge status: Home / Self Care | Payer: Medicaid Other | Attending: Emergency Medicine | Admitting: Emergency Medicine

## 2017-10-05 ENCOUNTER — Encounter (HOSPITAL_COMMUNITY): Payer: Self-pay

## 2017-10-05 ENCOUNTER — Emergency Department (HOSPITAL_COMMUNITY): Payer: Medicaid Other

## 2017-10-05 DIAGNOSIS — F840 Autistic disorder: Secondary | ICD-10-CM | POA: Insufficient documentation

## 2017-10-05 DIAGNOSIS — M542 Cervicalgia: Secondary | ICD-10-CM | POA: Diagnosis not present

## 2017-10-05 DIAGNOSIS — Z23 Encounter for immunization: Secondary | ICD-10-CM | POA: Diagnosis not present

## 2017-10-05 DIAGNOSIS — F17210 Nicotine dependence, cigarettes, uncomplicated: Secondary | ICD-10-CM | POA: Insufficient documentation

## 2017-10-05 DIAGNOSIS — S0081XA Abrasion of other part of head, initial encounter: Secondary | ICD-10-CM | POA: Insufficient documentation

## 2017-10-05 DIAGNOSIS — Y9289 Other specified places as the place of occurrence of the external cause: Secondary | ICD-10-CM | POA: Diagnosis not present

## 2017-10-05 DIAGNOSIS — R0781 Pleurodynia: Secondary | ICD-10-CM | POA: Insufficient documentation

## 2017-10-05 DIAGNOSIS — Y999 Unspecified external cause status: Secondary | ICD-10-CM | POA: Diagnosis not present

## 2017-10-05 DIAGNOSIS — S60512A Abrasion of left hand, initial encounter: Secondary | ICD-10-CM | POA: Insufficient documentation

## 2017-10-05 DIAGNOSIS — S0003XA Contusion of scalp, initial encounter: Secondary | ICD-10-CM | POA: Insufficient documentation

## 2017-10-05 DIAGNOSIS — M79642 Pain in left hand: Secondary | ICD-10-CM

## 2017-10-05 DIAGNOSIS — Y9389 Activity, other specified: Secondary | ICD-10-CM | POA: Diagnosis not present

## 2017-10-05 DIAGNOSIS — R040 Epistaxis: Secondary | ICD-10-CM | POA: Diagnosis not present

## 2017-10-05 DIAGNOSIS — Z79899 Other long term (current) drug therapy: Secondary | ICD-10-CM | POA: Diagnosis not present

## 2017-10-05 DIAGNOSIS — T07XXXA Unspecified multiple injuries, initial encounter: Secondary | ICD-10-CM

## 2017-10-05 DIAGNOSIS — S40811A Abrasion of right upper arm, initial encounter: Secondary | ICD-10-CM | POA: Diagnosis not present

## 2017-10-05 DIAGNOSIS — S80211A Abrasion, right knee, initial encounter: Secondary | ICD-10-CM | POA: Diagnosis not present

## 2017-10-05 DIAGNOSIS — S60511A Abrasion of right hand, initial encounter: Secondary | ICD-10-CM | POA: Diagnosis not present

## 2017-10-05 DIAGNOSIS — M79641 Pain in right hand: Secondary | ICD-10-CM

## 2017-10-05 DIAGNOSIS — S50811A Abrasion of right forearm, initial encounter: Secondary | ICD-10-CM | POA: Insufficient documentation

## 2017-10-05 DIAGNOSIS — S59911A Unspecified injury of right forearm, initial encounter: Secondary | ICD-10-CM | POA: Diagnosis present

## 2017-10-05 MED ORDER — IBUPROFEN 600 MG PO TABS: 600.0000 mg | ORAL_TABLET | Freq: Four times a day (QID) | ORAL | 0 refills | Status: DC | PRN

## 2017-10-05 MED ORDER — TETANUS-DIPHTH-ACELL PERTUSSIS 5-2.5-18.5 LF-MCG/0.5 IM SUSP
0.5000 mL | Freq: Once | INTRAMUSCULAR | Status: AC
  Administered 2017-10-05: 0.5 mL via INTRAMUSCULAR
  Filled 2017-10-05: qty 0.5

## 2017-10-05 MED ORDER — NAPROXEN 250 MG PO TABS
375.0000 mg | ORAL_TABLET | Freq: Once | ORAL | Status: AC
  Administered 2017-10-05: 375 mg via ORAL
  Filled 2017-10-05: qty 2

## 2017-10-05 MED ORDER — ACETAMINOPHEN 325 MG PO TABS: 650.0000 mg | ORAL_TABLET | Freq: Four times a day (QID) | ORAL | 0 refills | Status: DC | PRN

## 2017-10-05 MED ORDER — BACITRACIN ZINC 500 UNIT/GM EX OINT
TOPICAL_OINTMENT | Freq: Two times a day (BID) | CUTANEOUS | Status: DC
  Administered 2017-10-05: 1 via TOPICAL
  Filled 2017-10-05: qty 4.5
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
## 2017-10-05 NOTE — Progress Notes (Signed)
Orthopedic Tech Progress Note Patient Details:  [name] 12/22/1995 [id number]  Ortho Devices Type of Ortho Device: Velcro wrist forearm splint Ortho Device/Splint Location: RUE Ortho Device/Splint Interventions: Ordered, Application   Post Interventions Patient Tolerated: Well Instructions Provided: Care of device   Jennye MoccasinHughes, [name] 10/05/2017, 10:54 PM

## 2017-10-06 ENCOUNTER — Emergency Department (HOSPITAL_COMMUNITY)
Admission: EM | Admit: 2017-10-06 | Discharge: 2017-10-06 | Disposition: A | Discharge status: Home / Self Care | Payer: Medicaid Other | Source: Home / Self Care | Attending: Emergency Medicine | Admitting: Emergency Medicine

## 2017-10-06 ENCOUNTER — Emergency Department (HOSPITAL_COMMUNITY): Payer: Medicaid Other

## 2017-10-06 ENCOUNTER — Encounter (HOSPITAL_COMMUNITY): Payer: Self-pay | Admitting: Student

## 2017-10-06 MED ORDER — IBUPROFEN 800 MG PO TABS
800.0000 mg | ORAL_TABLET | Freq: Once | ORAL | Status: AC
  Administered 2017-10-06: 800 mg via ORAL
  Filled 2017-10-06: qty 1

## 2017-10-06 NOTE — ED Notes (Signed)
Patient transported to X-ray & CT °
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
## 2017-10-06 NOTE — ED Notes (Signed)
Social Work at bedside 
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
## 2017-10-06 NOTE — ED Notes (Signed)
Patient given sandwich and sprite 

## 2017-12-04 ENCOUNTER — Emergency Department (HOSPITAL_COMMUNITY)
Admission: EM | Admit: 2017-12-04 | Discharge: 2017-12-05 | Disposition: A | Discharge status: Home / Self Care | Payer: Medicaid Other | Attending: Emergency Medicine | Admitting: Emergency Medicine

## 2017-12-04 ENCOUNTER — Other Ambulatory Visit: Payer: Self-pay

## 2017-12-04 ENCOUNTER — Emergency Department (HOSPITAL_COMMUNITY): Payer: Medicaid Other

## 2017-12-04 ENCOUNTER — Encounter (HOSPITAL_COMMUNITY): Payer: Self-pay | Admitting: Emergency Medicine

## 2017-12-04 DIAGNOSIS — Z79899 Other long term (current) drug therapy: Secondary | ICD-10-CM | POA: Diagnosis not present

## 2017-12-04 DIAGNOSIS — M25561 Pain in right knee: Secondary | ICD-10-CM | POA: Insufficient documentation

## 2017-12-04 DIAGNOSIS — F17210 Nicotine dependence, cigarettes, uncomplicated: Secondary | ICD-10-CM | POA: Insufficient documentation
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
## 2017-12-05 NOTE — ED Notes (Signed)
E-signature not available, pt verbalized understanding of DC instructions  

## 2017-12-05 NOTE — ED Notes (Signed)
ED Provider at bedside. 

## 2017-12-05 NOTE — ED Notes (Signed)
Pt ambulated to nurse's station and back to bed, asking for something to drink, without difficulty or acute distress.

## 2017-12-05 NOTE — ED Provider Notes (Signed)
[name] Provider Note   CSN: [id number] Arrival date & time: 12/04/17  2300     History   Chief Complaint Chief Complaint  Patient presents with  . Knee Pain    HPI [name] is a 22 y.o. male.  Patient presents to the emergency department with a chief complaint of right knee pain.  He states that he slipped and fell landing directly on his right knee tonight.  He complains of moderate pain.  He has been able to ambulate after the fall.  He denies numbness, weakness, or tingling.  He states that he also stretched his hamstring.  He has taken 2 ibuprofen 800 mg tablets.  He denies any other injuries.  The history is provided by the patient. No language interpreter was used.    Past Medical History:  Diagnosis Date  . Autism   . Seizures (HCC)    none since 22 years old    There are no active problems to display for this patient.   Past Surgical History:  Procedure Laterality Date  . TOOTH EXTRACTION          Home Medications    Prior to Admission medications   Medication Sig Start Date End Date Taking? Authorizing Provider  acetaminophen (TYLENOL) 325 MG tablet Take 2 tablets (650 mg total) by mouth every 6 (six) hours as needed. 10/05/17   Rise MuLeaphart, Kenneth T, PA-C  benztropine (COGENTIN) 1 MG tablet Take 1 mg by mouth at bedtime.    [provider]  chlorproMAZINE (THORAZINE) 10 MG tablet Take 10 mg by mouth at bedtime.    [provider]  guanFACINE (INTUNIV) 1 MG TB24 ER tablet Take 1 mg by mouth at bedtime.    [provider]  ibuprofen (ADVIL,MOTRIN) 600 MG tablet Take 1 tablet (600 mg total) by mouth every 6 (six) hours as needed. Patient taking differently: Take 600 mg by mouth every 6 (six) hours as needed for moderate pain.  10/05/17   Rise MuLeaphart, Kenneth T, PA-C  lithium carbonate 300 MG capsule Take 1 capsule (300 mg total) by mouth 2 (two) times daily with a meal. 12/05/15   Adonis BrookAgustin,  Sheila, NP  OLANZapine (ZYPREXA) 10 MG tablet Take 10 mg by mouth at bedtime. 08/14/17   [provider]  OLANZapine (ZYPREXA) 2.5 MG tablet Take 1.25 mg by mouth 3 (three) times daily as needed (aggitation).    [provider]    Family History History reviewed. No pertinent family history.  Social History Social History   Tobacco Use  . Smoking status: Current Some Day Smoker    Packs/day: 1.00    Types: Cigarettes  . Smokeless tobacco: Never Used  Substance Use Topics  . Alcohol use: No  . Drug use: No    Comment: "BD"- stated he took one time      Allergies   Other   Review of Systems Review of Systems  All other systems reviewed and are negative.    Physical Exam Updated Vital Signs BP (!) 130/98 (BP Location: Right Arm)   Pulse 84   Temp 98.4 F (36.9 C) (Oral)   Resp (!) 24   Ht 5\' 9"  (1.753 m)   Wt 71.4 kg   SpO2 96%   BMI 23.26 kg/m   Physical Exam  Constitutional: He is oriented to person, place, and time. He appears well-developed and well-nourished.  HENT:  Head: Normocephalic and atraumatic.  Eyes: Conjunctivae and EOM are normal.  Neck: Normal range of motion.  Cardiovascular: Normal rate.  Pulmonary/Chest: Effort normal.  Abdominal: He exhibits no distension.  Musculoskeletal: Normal range of motion.  Minor abrasion to the anterior knee, no bony abnormality or deformity, no bony tenderness, stable varus and valgus testing, difficult to assess anterior drawer due to guarding  Neurological: He is alert and oriented to person, place, and time.  Skin: Skin is dry.  Psychiatric: He has a normal mood and affect. His behavior is normal. Judgment and thought content normal.  Nursing note and vitals reviewed.    ED Treatments / Results  Labs (all labs ordered are listed, but only abnormal results are displayed) Labs Reviewed - No data to display  EKG None  Radiology Dg Knee Complete 4 Views Right  Result Date:  12/04/2017 CLINICAL DATA:  Knee injury with pain EXAM: RIGHT KNEE - COMPLETE 4+ VIEW COMPARISON:  10/06/2017 FINDINGS: No evidence of fracture, dislocation, or joint effusion. No evidence of arthropathy or other focal bone abnormality. Soft tissues are unremarkable. IMPRESSION: Negative. Electronically Signed   By: Jasmine PangKim  Fujinaga M.D.   On: 12/04/2017 23:48    Procedures Procedures (including critical care time)  Medications Ordered in ED Medications - No data to display   Initial Impression / Assessment and Plan / ED Course  I have reviewed the triage vital signs and the nursing notes.  Pertinent labs & imaging results that were available during my care of the patient were reviewed by me and considered in my medical decision making (see chart for details).     Patient presents with injury to knee pain.  DDx includes, fracture, strain, or sprain.    Plain films reveal negative.  Pt advised to follow up with PCP and/or orthopedics. Patient given knee sleeve while in ED, conservative therapy such as RICE recommended and discussed.   Patient will be discharged home & is agreeable with above plan. Returns precautions discussed. Pt appears safe for discharge.   Final Clinical Impressions(s) / ED Diagnoses   Final diagnoses:  Acute pain of right knee    ED Discharge Orders    None       Roxy HorsemanBrowning, Abbeygail Igoe, PA-C 12/05/17 0156    Nira Connardama, Pedro Eduardo, MD 12/05/17 [phone]

## 2017-12-21 ENCOUNTER — Emergency Department (HOSPITAL_COMMUNITY): Payer: Medicaid Other

## 2017-12-21 ENCOUNTER — Emergency Department (HOSPITAL_COMMUNITY)
Admission: EM | Admit: 2017-12-21 | Discharge: 2017-12-21 | Disposition: A | Discharge status: Home / Self Care | Payer: Medicaid Other | Attending: Emergency Medicine | Admitting: Emergency Medicine

## 2017-12-21 ENCOUNTER — Encounter (HOSPITAL_COMMUNITY): Payer: Self-pay

## 2017-12-21 DIAGNOSIS — J069 Acute upper respiratory infection, unspecified: Secondary | ICD-10-CM | POA: Diagnosis not present

## 2017-12-21 DIAGNOSIS — Z79899 Other long term (current) drug therapy: Secondary | ICD-10-CM | POA: Insufficient documentation

## 2017-12-21 DIAGNOSIS — R0981 Nasal congestion: Secondary | ICD-10-CM | POA: Diagnosis present

## 2017-12-21 DIAGNOSIS — F17210 Nicotine dependence, cigarettes, uncomplicated: Secondary | ICD-10-CM | POA: Diagnosis not present

## 2017-12-21 DIAGNOSIS — R51 Headache: Secondary | ICD-10-CM | POA: Insufficient documentation

## 2017-12-21 DIAGNOSIS — R519 Headache, unspecified: Secondary | ICD-10-CM

## 2017-12-21 DIAGNOSIS — B9789 Other viral agents as the cause of diseases classified elsewhere: Secondary | ICD-10-CM

## 2017-12-21 MED ORDER — ONDANSETRON HCL 4 MG/2ML IJ SOLN
4.0000 mg | Freq: Once | INTRAMUSCULAR | Status: AC
  Administered 2017-12-21: 4 mg via INTRAVENOUS
  Filled 2017-12-21: qty 2

## 2017-12-21 MED ORDER — SODIUM CHLORIDE 0.9 % IV BOLUS
1000.0000 mL | Freq: Once | INTRAVENOUS | Status: AC
  Administered 2017-12-21: 1000 mL via INTRAVENOUS

## 2017-12-21 MED ORDER — DIPHENHYDRAMINE HCL 50 MG/ML IJ SOLN
25.0000 mg | Freq: Once | INTRAMUSCULAR | Status: AC
  Administered 2017-12-21: 25 mg via INTRAVENOUS
  Filled 2017-12-21: qty 1

## 2017-12-21 MED ORDER — KETOROLAC TROMETHAMINE 30 MG/ML IJ SOLN
30.0000 mg | Freq: Once | INTRAMUSCULAR | Status: AC
  Administered 2017-12-21: 30 mg via INTRAVENOUS
  Filled 2017-12-21: qty 1

## 2017-12-21 MED ORDER — PROCHLORPERAZINE EDISYLATE 10 MG/2ML IJ SOLN
10.0000 mg | Freq: Once | INTRAMUSCULAR | Status: AC
  Administered 2017-12-21: 10 mg via INTRAVENOUS
  Filled 2017-12-21: qty 2

## 2017-12-21 NOTE — ED Notes (Signed)
Patient verbalizes understanding of discharge instructions. Opportunity for questioning and answers were provided. Armband removed by staff, pt discharged from ED ambulatory.   

## 2017-12-21 NOTE — Discharge Instructions (Signed)
Your symptoms are likely caused by a viral upper respiratory infection. Antibiotics are not helpful in treating viral infection, the virus should run its course in about 5-7 days. Please make sure you are drinking plenty of fluids. You can treat your symptoms supportively with tylenol/ibuprofen for fevers and pains, Zyrtec and Flonase to help with nasal congestion, and over the counter cough syrups and throat lozenges to help with cough. If your symptoms are not improving please follow up with you Primary doctor.   If you develop persistent fevers, shortness of breath or difficulty breathing, chest pain, severe headache and neck pain, persistent nausea and vomiting or other new or concerning symptoms return to the Emergency department.  Get help right away if: You have trouble seeing. Fevers Worsening cough, chest pain or shortness of breath You suddenly have very bad pain in your face or head. You start to twitch or shake (seizure). You are confused. You have a stiff neck.

## 2017-12-21 NOTE — ED Provider Notes (Signed)
[name] EMERGENCY DEPARTMENT Provider Note   CSN: [id number] Arrival date & time: 12/21/17  1448     History   Chief Complaint No chief complaint on file.   HPI [name] is a 22 y.o. male.  [name] is a 22 y.o. Male with a history of autism and childhood seizures, who presents to the emergency department for 2 days of rhinorrhea, nasal congestion and productive cough of yellow-green sputum.  Patient reports late last night and today he began having frontal headache that is a constant throbbing ache.  He reports some light sensitivity but denies any associated vision changes, no nausea or vomiting, no facial asymmetry, no change in speech or difficulty swallowing and no numbness or weakness in extremities.  No associated neck pain or stiffness and no fevers or chills.  Patient denies chest pain or shortness of breath, no abdominal pain.  He has not taken any medications prior to arrival to manage his symptoms.  No known sick contacts.  No other aggravating or alleviating factors.     Past Medical History:  Diagnosis Date  . Autism   . Seizures (HCC)    none since 22 years old    There are no active problems to display for this patient.   Past Surgical History:  Procedure Laterality Date  . TOOTH EXTRACTION          Home Medications    Prior to Admission medications   Medication Sig Start Date End Date Taking? Authorizing Provider  acetaminophen (TYLENOL) 325 MG tablet Take 2 tablets (650 mg total) by mouth every 6 (six) hours as needed. 10/05/17   Rise Mu, PA-C  benztropine (COGENTIN) 1 MG tablet Take 1 mg by mouth at bedtime.    [provider]  chlorproMAZINE (THORAZINE) 10 MG tablet Take 10 mg by mouth at bedtime.    [provider]  guanFACINE (INTUNIV) 1 MG TB24 ER tablet Take 1 mg by mouth at bedtime.    [provider]  ibuprofen (ADVIL,MOTRIN) 600 MG tablet Take 1 tablet (600 mg total) by  mouth every 6 (six) hours as needed. Patient taking differently: Take 600 mg by mouth every 6 (six) hours as needed for moderate pain.  10/05/17   Rise Mu, PA-C  lithium carbonate 300 MG capsule Take 1 capsule (300 mg total) by mouth 2 (two) times daily with a meal. 12/05/15   Adonis Brook, NP  OLANZapine (ZYPREXA) 10 MG tablet Take 10 mg by mouth at bedtime. 08/14/17   [provider]  OLANZapine (ZYPREXA) 2.5 MG tablet Take 1.25 mg by mouth 3 (three) times daily as needed (aggitation).    [provider]    Family History History reviewed. No pertinent family history.  Social History Social History   Tobacco Use  . Smoking status: Current Some Day Smoker    Packs/day: 1.00    Types: Cigarettes  . Smokeless tobacco: Never Used  Substance Use Topics  . Alcohol use: No  . Drug use: No    Comment: "BD"- stated he took one time      Allergies   Other   Review of Systems Review of Systems  Constitutional: Negative for chills and fever.  HENT: Positive for congestion, postnasal drip, rhinorrhea, sinus pain and sore throat. Negative for ear pain, trouble swallowing and voice change.   Eyes: Positive for photophobia. Negative for pain, discharge, redness and visual disturbance.  Respiratory: Positive for cough. Negative for shortness of  breath and wheezing.   Cardiovascular: Negative for chest pain.  Gastrointestinal: Negative for abdominal pain, nausea and vomiting.  Musculoskeletal: Negative for neck pain and neck stiffness.  Skin: Negative for color change and rash.  Neurological: Positive for headaches. Negative for dizziness, facial asymmetry, speech difficulty, weakness, light-headedness and numbness.     Physical Exam Updated Vital Signs BP 115/67 (BP Location: Right Arm)   Pulse 88   Temp 98.5 F (36.9 C) (Oral)   Resp 18   SpO2 98%   Physical Exam  Constitutional: He is oriented to person, place, and time. He appears well-developed  and well-nourished. No distress.  HENT:  Head: Normocephalic and atraumatic.  Mouth/Throat: Oropharynx is clear and moist.  TMs clear with good landmarks, moderate nasal mucosa edema with clear rhinorrhea, posterior oropharynx clear and moist, with some erythema, no edema or exudates, uvula midline  Eyes: Pupils are equal, round, and reactive to light. Conjunctivae and EOM are normal. Right eye exhibits no discharge. Left eye exhibits no discharge.  Neck: Normal range of motion. Neck supple.  No rigidity, negative Kernig's and Brudzinski sign  Cardiovascular: Normal rate, regular rhythm, normal heart sounds and intact distal pulses.  Pulmonary/Chest: Effort normal and breath sounds normal. No respiratory distress.  Respirations equal and unlabored, patient able to speak in full sentences, lungs clear to auscultation bilaterally  Abdominal: Soft. Bowel sounds are normal. He exhibits no distension and no mass. There is no tenderness. There is no guarding.  Abdomen soft, nondistended, nontender to palpation in all quadrants without guarding or peritoneal signs  Musculoskeletal: He exhibits no edema or deformity.  Lymphadenopathy:    He has no cervical adenopathy.  Neurological: He is alert and oriented to person, place, and time. Coordination normal.  Speech is clear, able to follow commands CN III-XII intact Normal strength in upper and lower extremities bilaterally including dorsiflexion and plantar flexion, strong and equal grip strength Sensation normal to light and sharp touch Moves extremities without ataxia, coordination intact Normal finger to nose and rapid alternating movements No pronator drift  Skin: Skin is warm and dry. Capillary refill takes less than 2 seconds. He is not diaphoretic.  Psychiatric: He has a normal mood and affect. His behavior is normal.  Nursing note and vitals reviewed.    ED Treatments / Results  Labs (all labs ordered are listed, but only abnormal  results are displayed) Labs Reviewed - No data to display  EKG None  Radiology Dg Chest 2 View  Result Date: 12/21/2017 CLINICAL DATA:  Cough with headache EXAM: CHEST - 2 VIEW COMPARISON:  10/06/2017 FINDINGS: The heart size and mediastinal contours are within normal limits. Both lungs are clear. The visualized skeletal structures are unremarkable. IMPRESSION: No active cardiopulmonary disease. Electronically Signed   By: Jasmine Pang M.D.   On: 12/21/2017 15:56    Procedures Procedures (including critical care time)  Medications Ordered in ED Medications  sodium chloride 0.9 % bolus 1,000 mL (1,000 mLs Intravenous New Bag/Given 12/21/17 1549)  ondansetron (ZOFRAN) injection 4 mg (has no administration in time range)  diphenhydrAMINE (BENADRYL) injection 25 mg (has no administration in time range)  ketorolac (TORADOL) 30 MG/ML injection 30 mg (30 mg Intravenous Given 12/21/17 1550)  prochlorperazine (COMPAZINE) injection 10 mg (10 mg Intravenous Given 12/21/17 1550)  diphenhydrAMINE (BENADRYL) injection 25 mg (25 mg Intravenous Given 12/21/17 1550)     Initial Impression / Assessment and Plan / ED Course  I have reviewed the triage vital signs and  the nursing notes.  Pertinent labs & imaging results that were available during my care of the patient were reviewed by me and considered in my medical decision making (see chart for details).  Pt presents with nasal congestion cough and headache. Pt is well appearing and vitals are normal. Lungs CTA on exam. Pt CXR negative for acute infiltrate. Pt HA treated and improved while in ED.  Presentation most consistent with sinus headache non concerning for [facility], ICH, Meningitis, or temporal arteritis. Pt is afebrile with no focal neuro deficits, nuchal rigidity, or change in vision.  Patients symptoms are consistent with URI, likely viral etiology. Discussed that antibiotics are not indicated for viral infections. Pt will be discharged with symptomatic  treatment.  Verbalizes understanding and is agreeable with plan. Pt is hemodynamically stable & in NAD prior to dc.   Final Clinical Impressions(s) / ED Diagnoses   Final diagnoses:  Viral URI with cough  Bad headache    ED Discharge Orders    None       Dartha Lodge, [place] 12/21/17 1655    Tegeler, Canary Brim, MD 12/21/17 [phone]

## 2017-12-21 NOTE — ED Triage Notes (Signed)
Pt presents with 2 day h/o nasal congestion, cough with yellow phlegm.  Pt reports frontal headache that began today.

## 2017-12-21 NOTE — ED Notes (Signed)
Patient transported to X-ray 

## 2017-12-24 ENCOUNTER — Encounter (HOSPITAL_COMMUNITY): Payer: Self-pay | Admitting: Emergency Medicine

## 2017-12-24 ENCOUNTER — Emergency Department (HOSPITAL_COMMUNITY): Payer: Medicaid Other

## 2017-12-24 ENCOUNTER — Emergency Department (HOSPITAL_COMMUNITY)
Admission: EM | Admit: 2017-12-24 | Discharge: 2017-12-25 | Disposition: A | Discharge status: Home / Self Care | Payer: Medicaid Other | Attending: Emergency Medicine | Admitting: Emergency Medicine

## 2017-12-24 DIAGNOSIS — Y9389 Activity, other specified: Secondary | ICD-10-CM | POA: Diagnosis not present

## 2017-12-24 DIAGNOSIS — F840 Autistic disorder: Secondary | ICD-10-CM | POA: Diagnosis not present

## 2017-12-24 DIAGNOSIS — S0990XA Unspecified injury of head, initial encounter: Secondary | ICD-10-CM | POA: Diagnosis not present

## 2017-12-24 DIAGNOSIS — Y929 Unspecified place or not applicable: Secondary | ICD-10-CM | POA: Diagnosis not present

## 2017-12-24 DIAGNOSIS — Y999 Unspecified external cause status: Secondary | ICD-10-CM | POA: Insufficient documentation

## 2017-12-24 DIAGNOSIS — F17210 Nicotine dependence, cigarettes, uncomplicated: Secondary | ICD-10-CM | POA: Insufficient documentation

## 2017-12-24 DIAGNOSIS — Z79899 Other long term (current) drug therapy: Secondary | ICD-10-CM | POA: Diagnosis not present

## 2017-12-24 MED ORDER — TETANUS-DIPHTHERIA TOXOIDS TD 5-2 LFU IM INJ
0.5000 mL | INJECTION | Freq: Once | INTRAMUSCULAR | Status: DC
  Filled 2017-12-24: qty 0.5

## 2017-12-24 MED ORDER — IBUPROFEN 600 MG PO TABS: 600.0000 mg | ORAL_TABLET | Freq: Three times a day (TID) | ORAL | 0 refills | Status: DC | PRN
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
## 2017-12-24 NOTE — Discharge Instructions (Addendum)
Please return for any problem. Follow up with primary care provider as instructed.

## 2018-01-16 ENCOUNTER — Emergency Department (HOSPITAL_COMMUNITY)
Admission: EM | Admit: 2018-01-16 | Discharge: 2018-01-16 | Disposition: A | Discharge status: Home / Self Care | Payer: Medicaid Other | Attending: Emergency Medicine | Admitting: Emergency Medicine

## 2018-01-16 ENCOUNTER — Other Ambulatory Visit: Payer: Self-pay

## 2018-01-16 ENCOUNTER — Emergency Department (HOSPITAL_COMMUNITY): Payer: Medicaid Other

## 2018-01-16 ENCOUNTER — Encounter (HOSPITAL_COMMUNITY): Payer: Self-pay | Admitting: Emergency Medicine

## 2018-01-16 DIAGNOSIS — M25512 Pain in left shoulder: Secondary | ICD-10-CM | POA: Insufficient documentation

## 2018-01-16 DIAGNOSIS — Z79899 Other long term (current) drug therapy: Secondary | ICD-10-CM | POA: Diagnosis not present

## 2018-01-16 DIAGNOSIS — F17210 Nicotine dependence, cigarettes, uncomplicated: Secondary | ICD-10-CM | POA: Insufficient documentation

## 2018-01-16 DIAGNOSIS — M25511 Pain in right shoulder: Secondary | ICD-10-CM

## 2018-01-16 MED ORDER — IBUPROFEN 400 MG PO TABS
600.0000 mg | ORAL_TABLET | Freq: Once | ORAL | Status: AC
  Administered 2018-01-16: 600 mg via ORAL
  Filled 2018-01-16: qty 1

## 2018-01-16 NOTE — ED Provider Notes (Signed)
[name] EMERGENCY DEPARTMENT Provider Note   CSN: [id number] Arrival date & time: 01/16/18  1931     History   Chief Complaint Chief Complaint  Patient presents with  . Shoulder Pain    HPI [name] is a 22 y.o. male here for evaluation of left shoulder pain.  Pain is moderate.  Onset today while patient was walking.  Sudden, intermittent, worse with raising of the arm above head level and palpation.  States had transient, resolved paresthesias to the entire left upper extremity.  He denies falls or trauma but states he was doing push-ups 2 days ago.  No interventions PTA.  No alleviating factors.  He is right-hand dominant.  Denies associated swelling, redness, warmth, loss of sensation distally, neck pain.  HPI  Past Medical History:  Diagnosis Date  . Autism   . Seizures (HCC)    none since 22 years old    There are no active problems to display for this patient.   Past Surgical History:  Procedure Laterality Date  . TOOTH EXTRACTION          Home Medications    Prior to Admission medications   Medication Sig Start Date End Date Taking? Authorizing Provider  acetaminophen (TYLENOL) 325 MG tablet Take 2 tablets (650 mg total) by mouth every 6 (six) hours as needed. Patient taking differently: Take 650 mg by mouth every 6 (six) hours as needed for mild pain, moderate pain or headache.  10/05/17   Leaphart, Lynann Beaver, PA-C  benztropine (COGENTIN) 1 MG tablet Take 1 mg by mouth at bedtime.    [provider]  chlorproMAZINE (THORAZINE) 10 MG tablet Take 10 mg by mouth at bedtime.    [provider]  ibuprofen (ADVIL,MOTRIN) 600 MG tablet Take 1 tablet (600 mg total) by mouth every 6 (six) hours as needed. Patient taking differently: Take 600 mg by mouth every 6 (six) hours as needed for headache, mild pain or moderate pain.  10/05/17   Rise Mu, PA-C  ibuprofen (ADVIL,MOTRIN) 600 MG tablet Take 1 tablet (600 mg  total) by mouth every 8 (eight) hours as needed. 12/24/17   Wynetta Fines, MD  lithium carbonate 300 MG capsule Take 1 capsule (300 mg total) by mouth 2 (two) times daily with a meal. 12/05/15   Adonis Brook, NP  OLANZapine (ZYPREXA) 10 MG tablet Take 10 mg by mouth at bedtime. 08/14/17   [provider]  OLANZapine (ZYPREXA) 2.5 MG tablet Take 1.25 mg by mouth 3 (three) times daily as needed (aggitation).    [provider]    Family History No family history on file.  Social History Social History   Tobacco Use  . Smoking status: Current Some Day Smoker    Packs/day: 1.00    Types: Cigarettes  . Smokeless tobacco: Never Used  Substance Use Topics  . Alcohol use: No  . Drug use: No    Comment: "BD"- stated he took one time      Allergies   Other   Review of Systems Review of Systems  Musculoskeletal: Positive for arthralgias.  All other systems reviewed and are negative.    Physical Exam Updated Vital Signs BP 118/72   Pulse 68   Temp 98 F (36.7 C)   Resp 18   SpO2 99%   Physical Exam  Constitutional: He is oriented to person, place, and time. He appears well-developed and well-nourished.  Non-toxic appearance.  HENT:  Head: Normocephalic.  Right Ear: External ear normal.  Left Ear: External ear normal.  Nose: Nose normal.  Eyes: Conjunctivae and EOM are normal.  Neck: Full passive range of motion without pain.  c-spine: no midline or paraspinal muscle tenderness.   Cardiovascular: Normal rate.  1+ radial and ulnar pulses bilaterally   Pulmonary/Chest: Effort normal. No tachypnea. No respiratory distress.  Musculoskeletal: Normal range of motion. He exhibits tenderness.  Left shoulder: Mild tenderness to biceps grove and anterior shoulder.  Pain with passive flexion and abduction of shoulder past head level.  Positive Neer's and Hawkin's.  Positive Speed's test. No obvious skin abnormalities including abrasions, ecchymosis, erythema,  edema No point tenderness to sternum, anterior chest wall, scapula, clavicle, AC or [place] joints No tenderness to trapezius  Negative drop arm test  Neurological: He is alert and oriented to person, place, and time.  Sensation to light touch in median, ulnar, radial nerve distribution intact bilaterally. 5/5 strength with hand grip, elbow flexion/extension, shoulder abd/add bilaterally.   Skin: Skin is warm and dry. Capillary refill takes less than 2 seconds.  Skin over left shoulder w/o erythema, abrasions, ecchymosis, warmth   Psychiatric: His behavior is normal. Thought content normal.     ED Treatments / Results  Labs (all labs ordered are listed, but only abnormal results are displayed) Labs Reviewed - No data to display  EKG None  Radiology Dg Shoulder Left  Result Date: 01/16/2018 CLINICAL DATA:  Left shoulder pain EXAM: LEFT SHOULDER - 2+ VIEW COMPARISON:  None. FINDINGS: There is no evidence of fracture or dislocation. There is no evidence of arthropathy or other focal bone abnormality. Soft tissues are unremarkable. IMPRESSION: Negative. Electronically Signed   By: Jasmine Pang M.D.   On: 01/16/2018 21:10    Procedures Procedures (including critical care time)  Medications Ordered in ED Medications  ibuprofen (ADVIL,MOTRIN) tablet 600 mg (600 mg Oral Given 01/16/18 1957)  ibuprofen (ADVIL,MOTRIN) tablet 600 mg (600 mg Oral Given 01/16/18 2136)     Initial Impression / Assessment and Plan / ED Course  I have reviewed the triage vital signs and the nursing notes.  Pertinent labs & imaging results that were available during my care of the patient were reviewed by me and considered in my medical decision making (see chart for details).  Clinical Course as of Jan 16 2242  Tue Jan 16, 2018  2057 Pt not in room    [CG]    Clinical Course User Index [CG] Liberty Handy, PA-C    22 year old here with left shoulder pain.  Exam as above is reassuring, he has focal  tenderness to biceps groove and anterior deltoid with reproducible pain with movement of the arm above head level.  Positive Hawkins and Neer's, speeds test which suggest some soft tissue injury versus tendinitis.  X-ray obtained at triage reviewed and negative.  No signs of skin erythema, edema, fluctuance.  No signs or symptoms to suggest joint infection, cellulitis.  Extremities neurovascularly intact otherwise.  He has no associated C-spine tenderness or radicular symptoms.  Will discharge with rice protocol.  Follow-up with PCP/orthopedist for persistent symptoms.  Discussed return precautions.  Patient is in agreement. Final Clinical Impressions(s) / ED Diagnoses   Final diagnoses:  Acute pain of right shoulder    ED Discharge Orders    None       Jerrell Mylar 01/16/18 2243    Benjiman Core, MD 01/17/18 0005

## 2018-01-16 NOTE — ED Triage Notes (Signed)
Pt reports he "dislocated my shoulder.... It looks different."  Pt is rather anxious in triage. Is unable to identify any injury "it just happened."

## 2018-01-16 NOTE — ED Notes (Signed)
Pt reports shoulder pain that just started suddenly. Pt denies injury. Same does not have any obvious deformity.

## 2018-01-16 NOTE — Discharge Instructions (Addendum)
You were seen in the ER for left shoulder pain.  X-rays are normal.   Pain is likely from overuse injury due to recent increase in activity.   The initial treatment includes Rest, Ice, Compression, and Elevation (RICE) therapy during the first 24 to 48 hours of treatment to limit swelling, and pain. Cryotherapy (icing) reduces pain and inflammation. Apply ice to the affected area for approximately 15 minutes every one to two hours while awake. Ice is not applied directly to the skin to avoid cold-induced injury, use a rag or towel to protect skin.     Start passive range of motion exercises and stretches including to the point of mild discomfort as early as possible and tolerated. This will help with pain reduction, improved mobility and flexibility.    Can alternate ibuprofen and acetaminophen as needed for pain. Transition back into activity only when pain has completely resolved to avoid further injury.    Return for changes to color of skin over extremity, swelling, redness, warmth, fever over joint

## 2018-01-16 NOTE — ED Provider Notes (Signed)
Patient placed in Quick Look pathway, seen and evaluated   Chief Complaint: shoulder pain  HPI: [name] is a 22 y.o. male who presents to the ED with left shoulder pain. Patient reports that the pain started earlier today and is worse. He reports that his left shoulder looks different from the right and he thinks he dislocated the left one.   ROS: M/S: shoulder pain  Physical Exam:  BP 112/68   Pulse 74   Temp 98 F (36.7 C)   Resp 17   SpO2 98%    Gen: No distress  Neuro: Awake and Alert  Skin: Warm and dry  M/S: tender with palpation of the left shoulder   Initiation of care has begun. The patient has been counseled on the process, plan, and necessity for staying for the completion/evaluation, and the remainder of the medical screening examination    Janne Napoleon, NP 01/16/18 Alphia Kava, MD 01/16/18 2355

## 2018-01-16 NOTE — ED Notes (Signed)
Patient verbalizes understanding of discharge instructions. Opportunity for questioning and answers were provided. Armband removed by staff, pt discharged from ED home via POV.  

## 2018-02-11 ENCOUNTER — Emergency Department (HOSPITAL_COMMUNITY)
Admission: EM | Admit: 2018-02-11 | Discharge: 2018-02-11 | Disposition: A | Discharge status: Home / Self Care | Payer: Medicaid Other | Attending: Emergency Medicine | Admitting: Emergency Medicine

## 2018-02-11 ENCOUNTER — Emergency Department (HOSPITAL_COMMUNITY): Payer: Medicaid Other

## 2018-02-11 ENCOUNTER — Other Ambulatory Visit: Payer: Self-pay

## 2018-02-11 ENCOUNTER — Encounter (HOSPITAL_COMMUNITY): Payer: Self-pay | Admitting: Emergency Medicine

## 2018-02-11 DIAGNOSIS — M79622 Pain in left upper arm: Secondary | ICD-10-CM | POA: Insufficient documentation

## 2018-02-11 DIAGNOSIS — F840 Autistic disorder: Secondary | ICD-10-CM | POA: Insufficient documentation

## 2018-02-11 DIAGNOSIS — M79642 Pain in left hand: Secondary | ICD-10-CM | POA: Insufficient documentation

## 2018-02-11 DIAGNOSIS — Y999 Unspecified external cause status: Secondary | ICD-10-CM | POA: Diagnosis not present

## 2018-02-11 DIAGNOSIS — S0001XA Abrasion of scalp, initial encounter: Secondary | ICD-10-CM | POA: Diagnosis not present

## 2018-02-11 DIAGNOSIS — Y929 Unspecified place or not applicable: Secondary | ICD-10-CM | POA: Diagnosis not present

## 2018-02-11 DIAGNOSIS — R0789 Other chest pain: Secondary | ICD-10-CM | POA: Diagnosis not present

## 2018-02-11 DIAGNOSIS — F17210 Nicotine dependence, cigarettes, uncomplicated: Secondary | ICD-10-CM | POA: Diagnosis not present

## 2018-02-11 DIAGNOSIS — R109 Unspecified abdominal pain: Secondary | ICD-10-CM | POA: Diagnosis not present

## 2018-02-11 DIAGNOSIS — R042 Hemoptysis: Secondary | ICD-10-CM | POA: Insufficient documentation

## 2018-02-11 DIAGNOSIS — Z79899 Other long term (current) drug therapy: Secondary | ICD-10-CM | POA: Diagnosis not present

## 2018-02-11 DIAGNOSIS — T148XXA Other injury of unspecified body region, initial encounter: Secondary | ICD-10-CM | POA: Insufficient documentation

## 2018-02-11 DIAGNOSIS — Y939 Activity, unspecified: Secondary | ICD-10-CM | POA: Diagnosis not present

## 2018-02-11 DIAGNOSIS — S0990XA Unspecified injury of head, initial encounter: Secondary | ICD-10-CM | POA: Diagnosis present

## 2018-02-11 LAB — URINALYSIS, ROUTINE W REFLEX MICROSCOPIC
Bilirubin Urine: NEGATIVE
Glucose, UA: NEGATIVE mg/dL
HGB URINE DIPSTICK: NEGATIVE
Ketones, ur: NEGATIVE mg/dL
LEUKOCYTES UA: NEGATIVE
NITRITE: NEGATIVE
PROTEIN: NEGATIVE mg/dL
Specific Gravity, Urine: 1.011 (ref 1.005–1.030)
pH: 7 (ref 5.0–8.0)

## 2018-02-11 MED ORDER — ACETAMINOPHEN 500 MG PO TABS
1000.0000 mg | ORAL_TABLET | Freq: Once | ORAL | Status: AC
  Administered 2018-02-11: 1000 mg via ORAL
  Filled 2018-02-11: qty 2

## 2018-02-11 NOTE — ED Notes (Signed)
Pt transported to radiology.
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
## 2018-02-11 NOTE — ED Notes (Signed)
Pt provided with [place] sandwich and water, okay'd by EDP

## 2018-02-11 NOTE — ED Notes (Addendum)
Pt provided urinal and made aware of need for sample.  Made aware of need for urgency on urine.  Provided cups of water with Tylenol.

## 2018-02-11 NOTE — ED Notes (Signed)
Patient able to ambulate independently  

## 2018-02-11 NOTE — Discharge Instructions (Addendum)
Take Tylenol as directed for pain.  You can hold an ice pack over the painful areas 4 times daily for 30 minutes at a time.  See an urgent care center if you continue to have significant pain in a week.  Return if concern for any reason

## 2018-05-11 DIAGNOSIS — Z79899 Other long term (current) drug therapy: Secondary | ICD-10-CM | POA: Insufficient documentation

## 2018-05-11 DIAGNOSIS — F0781 Postconcussional syndrome: Secondary | ICD-10-CM | POA: Diagnosis not present

## 2018-05-11 DIAGNOSIS — G44309 Post-traumatic headache, unspecified, not intractable: Secondary | ICD-10-CM | POA: Insufficient documentation

## 2018-05-11 DIAGNOSIS — F17210 Nicotine dependence, cigarettes, uncomplicated: Secondary | ICD-10-CM | POA: Diagnosis not present

## 2018-05-11 DIAGNOSIS — R51 Headache: Secondary | ICD-10-CM | POA: Diagnosis present

## 2018-05-12 ENCOUNTER — Encounter (HOSPITAL_COMMUNITY): Payer: Self-pay | Admitting: Emergency Medicine

## 2018-05-12 ENCOUNTER — Emergency Department (HOSPITAL_COMMUNITY)
Admission: EM | Admit: 2018-05-12 | Discharge: 2018-05-12 | Disposition: A | Discharge status: Home / Self Care | Payer: Medicaid Other | Attending: Emergency Medicine | Admitting: Emergency Medicine

## 2018-05-12 ENCOUNTER — Other Ambulatory Visit: Payer: Self-pay

## 2018-05-12 DIAGNOSIS — F0781 Postconcussional syndrome: Secondary | ICD-10-CM

## 2018-05-12 MED ORDER — ACETAMINOPHEN 325 MG PO TABS
650.0000 mg | ORAL_TABLET | Freq: Once | ORAL | Status: AC
  Administered 2018-05-12: 650 mg via ORAL
  Filled 2018-05-12: qty 2

## 2018-05-12 NOTE — ED Triage Notes (Signed)
Pt reports he had a self diagnosed concussion a month ago. Pt reports since then he has been having headaches.
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
## 2018-09-25 ENCOUNTER — Other Ambulatory Visit: Payer: Self-pay

## 2018-09-25 ENCOUNTER — Emergency Department (HOSPITAL_COMMUNITY)
Admission: EM | Admit: 2018-09-25 | Discharge: 2018-09-25 | Disposition: A | Discharge status: Home / Self Care | Payer: Medicaid Other | Attending: Emergency Medicine | Admitting: Emergency Medicine

## 2018-09-25 ENCOUNTER — Emergency Department (HOSPITAL_COMMUNITY): Payer: Medicaid Other

## 2018-09-25 DIAGNOSIS — W2209XA Striking against other stationary object, initial encounter: Secondary | ICD-10-CM | POA: Insufficient documentation

## 2018-09-25 DIAGNOSIS — Y929 Unspecified place or not applicable: Secondary | ICD-10-CM | POA: Insufficient documentation

## 2018-09-25 DIAGNOSIS — Y939 Activity, unspecified: Secondary | ICD-10-CM | POA: Insufficient documentation

## 2018-09-25 DIAGNOSIS — Y999 Unspecified external cause status: Secondary | ICD-10-CM | POA: Diagnosis not present

## 2018-09-25 DIAGNOSIS — S6992XA Unspecified injury of left wrist, hand and finger(s), initial encounter: Secondary | ICD-10-CM | POA: Diagnosis not present

## 2018-09-25 DIAGNOSIS — M79645 Pain in left finger(s): Secondary | ICD-10-CM | POA: Diagnosis present

## 2018-09-25 MED ORDER — IBUPROFEN 800 MG PO TABS
800.0000 mg | ORAL_TABLET | Freq: Once | ORAL | Status: AC
  Administered 2018-09-25: 800 mg via ORAL
  Filled 2018-09-25: qty 1
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
## 2018-09-25 NOTE — Discharge Instructions (Addendum)
Use the splint for pain and swelling. Use tylenol and ibuprofen as needed for pain.  Use ice for pain and swelling. Keep your finger elevated.  You will likely have continued pain and swelling for the next sever days to weeks, but it shouldn't be worsening greatly.  Return to the ER with any new, worsening, or concerning symptoms.

## 2018-09-25 NOTE — Progress Notes (Signed)
Orthopedic Tech Progress Note Patient Details:  [name] July 26, 1995 [id number]  Ortho Devices Type of Ortho Device: Finger splint Ortho Device/Splint Interventions: Other (comment)   Post Interventions Patient Tolerated: Well Instructions Provided: Adjustment of device, Care of device   Janit Pagan 09/25/2018, 6:27 PM
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
## 2018-09-25 NOTE — ED Notes (Signed)
Patient verbalizes understanding of discharge instructions. Opportunity for questioning and answering were provided.  patient discharged from ED.  

## 2018-09-30 ENCOUNTER — Emergency Department (HOSPITAL_COMMUNITY)
Admission: EM | Admit: 2018-09-30 | Discharge: 2018-09-30 | Disposition: A | Discharge status: Home / Self Care | Payer: Medicaid Other | Attending: Emergency Medicine | Admitting: Emergency Medicine

## 2018-09-30 ENCOUNTER — Emergency Department (HOSPITAL_COMMUNITY): Payer: Medicaid Other

## 2018-09-30 ENCOUNTER — Encounter (HOSPITAL_COMMUNITY): Payer: Self-pay | Admitting: *Deleted

## 2018-09-30 ENCOUNTER — Other Ambulatory Visit: Payer: Self-pay

## 2018-09-30 DIAGNOSIS — F17210 Nicotine dependence, cigarettes, uncomplicated: Secondary | ICD-10-CM | POA: Diagnosis not present

## 2018-09-30 DIAGNOSIS — R569 Unspecified convulsions: Secondary | ICD-10-CM | POA: Insufficient documentation

## 2018-09-30 DIAGNOSIS — Z79899 Other long term (current) drug therapy: Secondary | ICD-10-CM | POA: Insufficient documentation

## 2018-09-30 DIAGNOSIS — F840 Autistic disorder: Secondary | ICD-10-CM | POA: Insufficient documentation

## 2018-09-30 DIAGNOSIS — R0789 Other chest pain: Secondary | ICD-10-CM | POA: Insufficient documentation

## 2018-09-30 DIAGNOSIS — M542 Cervicalgia: Secondary | ICD-10-CM | POA: Diagnosis not present

## 2018-09-30 DIAGNOSIS — R0781 Pleurodynia: Secondary | ICD-10-CM

## 2018-09-30 HISTORY — DX: Bipolar disorder, unspecified: F31.9

## 2018-09-30 LAB — URINALYSIS, ROUTINE W REFLEX MICROSCOPIC
Bilirubin Urine: NEGATIVE
Glucose, UA: NEGATIVE mg/dL
Hgb urine dipstick: NEGATIVE
Ketones, ur: NEGATIVE mg/dL
Leukocytes,Ua: NEGATIVE
Nitrite: NEGATIVE
Protein, ur: NEGATIVE mg/dL
Specific Gravity, Urine: 1.019 (ref 1.005–1.030)
pH: 6 (ref 5.0–8.0)

## 2018-09-30 MED ORDER — ACETAMINOPHEN 325 MG PO TABS
650.0000 mg | ORAL_TABLET | Freq: Once | ORAL | Status: AC
  Administered 2018-09-30: 650 mg via ORAL
  Filled 2018-09-30: qty 2
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
## 2018-10-06 ENCOUNTER — Emergency Department (HOSPITAL_COMMUNITY): Payer: Medicaid Other

## 2018-10-06 ENCOUNTER — Encounter (HOSPITAL_COMMUNITY): Payer: Self-pay | Admitting: Emergency Medicine

## 2018-10-06 ENCOUNTER — Other Ambulatory Visit: Payer: Self-pay

## 2018-10-06 ENCOUNTER — Emergency Department (HOSPITAL_COMMUNITY)
Admission: EM | Admit: 2018-10-06 | Discharge: 2018-10-07 | Disposition: A | Discharge status: Home / Self Care | Payer: Medicaid Other | Attending: Emergency Medicine | Admitting: Emergency Medicine

## 2018-10-06 DIAGNOSIS — Y939 Activity, unspecified: Secondary | ICD-10-CM | POA: Insufficient documentation

## 2018-10-06 DIAGNOSIS — Z79899 Other long term (current) drug therapy: Secondary | ICD-10-CM | POA: Insufficient documentation

## 2018-10-06 DIAGNOSIS — S0990XA Unspecified injury of head, initial encounter: Secondary | ICD-10-CM | POA: Diagnosis not present

## 2018-10-06 DIAGNOSIS — Y999 Unspecified external cause status: Secondary | ICD-10-CM | POA: Diagnosis not present

## 2018-10-06 DIAGNOSIS — F17210 Nicotine dependence, cigarettes, uncomplicated: Secondary | ICD-10-CM | POA: Diagnosis not present

## 2018-10-06 DIAGNOSIS — Y929 Unspecified place or not applicable: Secondary | ICD-10-CM | POA: Insufficient documentation
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
## 2018-10-07 MED ORDER — IBUPROFEN 800 MG PO TABS
800.0000 mg | ORAL_TABLET | Freq: Once | ORAL | Status: AC
  Administered 2018-10-07: 800 mg via ORAL
  Filled 2018-10-07: qty 1
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
## 2018-10-07 NOTE — ED Notes (Signed)
Patient given food on discharge,sandwich,cheese stick and shasta

## 2018-10-07 NOTE — Discharge Instructions (Addendum)
Use Tylenol and ibuprofen as needed for headache. Use ice to help with pain and swelling. Return to the emergency room if you develop vision changes, vomiting, severe worsening head pain, or with any new, worsening, concerning symptoms.

## 2018-10-28 ENCOUNTER — Emergency Department (HOSPITAL_COMMUNITY): Payer: Medicaid Other

## 2018-10-28 ENCOUNTER — Other Ambulatory Visit: Payer: Self-pay

## 2018-10-28 ENCOUNTER — Encounter (HOSPITAL_COMMUNITY): Payer: Self-pay | Admitting: Emergency Medicine

## 2018-10-28 ENCOUNTER — Emergency Department (HOSPITAL_COMMUNITY)
Admission: EM | Admit: 2018-10-28 | Discharge: 2018-10-28 | Disposition: A | Discharge status: Home / Self Care | Payer: Medicaid Other | Attending: Emergency Medicine | Admitting: Emergency Medicine

## 2018-10-28 DIAGNOSIS — Y929 Unspecified place or not applicable: Secondary | ICD-10-CM | POA: Diagnosis not present

## 2018-10-28 DIAGNOSIS — Y9302 Activity, running: Secondary | ICD-10-CM | POA: Diagnosis not present

## 2018-10-28 DIAGNOSIS — M25562 Pain in left knee: Secondary | ICD-10-CM | POA: Diagnosis present

## 2018-10-28 DIAGNOSIS — F840 Autistic disorder: Secondary | ICD-10-CM | POA: Diagnosis not present

## 2018-10-28 DIAGNOSIS — F17210 Nicotine dependence, cigarettes, uncomplicated: Secondary | ICD-10-CM | POA: Insufficient documentation

## 2018-10-28 DIAGNOSIS — S8992XA Unspecified injury of left lower leg, initial encounter: Secondary | ICD-10-CM

## 2018-10-28 DIAGNOSIS — W2209XA Striking against other stationary object, initial encounter: Secondary | ICD-10-CM | POA: Diagnosis not present

## 2018-10-28 DIAGNOSIS — F319 Bipolar disorder, unspecified: Secondary | ICD-10-CM | POA: Insufficient documentation

## 2018-10-28 DIAGNOSIS — Z79899 Other long term (current) drug therapy: Secondary | ICD-10-CM | POA: Insufficient documentation

## 2018-10-28 DIAGNOSIS — Y999 Unspecified external cause status: Secondary | ICD-10-CM | POA: Insufficient documentation

## 2018-10-28 DIAGNOSIS — S81012A Laceration without foreign body, left knee, initial encounter: Secondary | ICD-10-CM | POA: Insufficient documentation

## 2018-10-28 MED ORDER — ACETAMINOPHEN 325 MG PO TABS
650.0000 mg | ORAL_TABLET | Freq: Once | ORAL | Status: AC
  Administered 2018-10-28: 650 mg via ORAL
  Filled 2018-10-28: qty 2

## 2018-10-28 MED ORDER — BACITRACIN ZINC 500 UNIT/GM EX OINT
TOPICAL_OINTMENT | Freq: Once | CUTANEOUS | Status: AC
  Administered 2018-10-28: 1 via TOPICAL
  Filled 2018-10-28: qty 0.9

## 2018-10-28 NOTE — ED Triage Notes (Signed)
Pt brought in by [facility]. Pt fell while running and hit his left knee. Small laceration with no bleeding noted. 10/10 pain. Pt has history of ADHD, bipolar, schizophrenia and ADD.    118/82 92 HR 98% RA

## 2018-10-28 NOTE — ED Notes (Signed)
Patient verbalizes understanding of discharge instructions. Opportunity for questioning and answers were provided. Armband removed by staff, pt discharged from ED ambulatory to home.  

## 2018-10-28 NOTE — ED Provider Notes (Signed)
[facility] EMERGENCY DEPARTMENT Provider Note   CSN: [id number] Arrival date & time: 10/28/18  1058     History   Chief Complaint Chief Complaint  Patient presents with  . Knee Pain    HPI [name] is a 23 y.o. male with a hx of tobacco use, autism, bipolar 1 disorder, & seizures who presents to the ED w/ complaints of L knee injury which occurred @ 1800 last night. Patient states he was running & accidentally ran into a guardrail with the L knee. He did not have any fall, head injury, or LOC with this. He states he has isolated injury to the L knee which involves a wound & is painful. Pain is 10/10 in severity, much worse with movement, no alleviating factors. Denies numbness, tingling, or weakness, states last tetanus was within past 5 years.      HPI  Past Medical History:  Diagnosis Date  . Autism   . Bipolar 1 disorder ([place])   . Seizures ([place])    none since 23 years old    There are no active problems to display for this patient.   Past Surgical History:  Procedure Laterality Date  . TOOTH EXTRACTION          Home Medications    Prior to Admission medications   Medication Sig Start Date End Date Taking? Authorizing Provider  acetaminophen (TYLENOL) 325 MG tablet Take 2 tablets (650 mg total) by mouth every 6 (six) hours as needed. Patient taking differently: Take 650 mg by mouth every 6 (six) hours as needed for mild pain, moderate pain or headache.  10/05/17   Leaphart, Zack Seal, PA-C  benztropine (COGENTIN) 1 MG tablet Take 1 mg by mouth at bedtime.    [provider]  chlorproMAZINE (THORAZINE) 10 MG tablet Take 10 mg by mouth at bedtime.    [provider]  ibuprofen (ADVIL,MOTRIN) 600 MG tablet Take 1 tablet (600 mg total) by mouth every 6 (six) hours as needed. Patient taking differently: Take 600 mg by mouth every 6 (six) hours as needed for headache, mild pain or moderate pain.  10/05/17   Doristine Devoid,  PA-C  ibuprofen (ADVIL,MOTRIN) 600 MG tablet Take 1 tablet (600 mg total) by mouth every 8 (eight) hours as needed. 12/24/17   Valarie Merino, MD  lithium carbonate 300 MG capsule Take 1 capsule (300 mg total) by mouth 2 (two) times daily with a meal. 12/05/15   Kerrie Buffalo, NP  OLANZapine (ZYPREXA) 10 MG tablet Take 10 mg by mouth at bedtime. 08/14/17   [provider]  OLANZapine (ZYPREXA) 2.5 MG tablet Take 1.25 mg by mouth 3 (three) times daily as needed (aggitation).    [provider]    Family History No family history on file.  Social History Social History   Tobacco Use  . Smoking status: Current Some Day Smoker    Packs/day: 2.00    Types: Cigarettes  . Smokeless tobacco: Never Used  . Tobacco comment: 4 packs a day  Substance Use Topics  . Alcohol use: No  . Drug use: No    Comment: "BD"- stated he took one time      Allergies   Other   Review of Systems Review of Systems  Constitutional: Negative for chills and fever.  Respiratory: Negative for shortness of breath.   Cardiovascular: Negative for chest pain.  Gastrointestinal: Negative for abdominal pain.  Musculoskeletal: Positive for arthralgias.  Skin: Positive for  wound.  Neurological: Negative for weakness and numbness.     Physical Exam Updated Vital Signs BP 108/77 (BP Location: Right Arm)   Pulse 70   Temp 98.8 F (37.1 C) (Oral)   Resp 20   Ht 5\' 9"  (1.753 m)   Wt 68 kg   SpO2 98%   BMI 22.15 kg/m   Physical Exam Vitals signs and nursing note reviewed.  Constitutional:      General: He is not in acute distress.    Appearance: He is not ill-appearing or toxic-appearing.  HENT:     Head: Normocephalic and atraumatic.  Cardiovascular:     Pulses:          Dorsalis pedis pulses are 2+ on the right side and 2+ on the left side.       Posterior tibial pulses are 2+ on the right side and 2+ on the left side.  Pulmonary:     Effort: Pulmonary effort is normal.   Musculoskeletal:     Comments: Lower extremities: There is a 1 cm linear laceration to the lateral aspect of the anterior knee without active bleeding or appreciable FBs, approximately 2 mm in depth. No obvious deformity, appreciable swelling, edema, erythema, ecchymosis, or warmth. Patient has intact AROM to bilateral hips, knees, ankles, and all digits with the exception of L knee flexion limitation per patient secondary to pain- he is able to flex to about 90 dgrees. Tender to palpation to the diffuse anterior L knee including the patella, otherwise nontender.   Skin:    General: Skin is warm and dry.     Capillary Refill: Capillary refill takes less than 2 seconds.  Neurological:     Mental Status: He is alert.     Comments: Alert. Clear speech. Sensation grossly intact to bilateral lower extremities. 5/5 strength with plantar/dorsiflexion bilaterally & knee flexion/extension bilaterally   Psychiatric:        Mood and Affect: Mood normal.        Behavior: Behavior normal.    ED Treatments / Results  Labs (all labs ordered are listed, but only abnormal results are displayed) Labs Reviewed - No data to display  EKG None  Radiology Dg Knee Complete 4 Views Left  Result Date: 10/28/2018 CLINICAL DATA:  Fall on guard rail. Left knee pain and decreased range of motion. Initial encounter. EXAM: LEFT KNEE - COMPLETE 4+ VIEW COMPARISON:  None. FINDINGS: No evidence of fracture, dislocation, or joint effusion. No evidence of arthropathy or other focal bone abnormality. Soft tissues are unremarkable. IMPRESSION: Negative. Electronically Signed   By: Danae OrleansJohn A Stahl M.D.   On: 10/28/2018 11:48    Procedures Procedures (including critical care time)  SPLINT APPLICATION Date/Time: 12:10 PM Authorized by: Harvie HeckSamantha Emanuelle Hammerstrom Consent: Verbal consent obtained. Risks and benefits: risks, benefits and alternatives were discussed Consent given by: patient Splint applied by: tech/nursing staff  Location details: LLE Splint type: Knee immobilizer  Supplies used: Knee immobilizer  Post-procedure: The splinted body part was neurovascularly unchanged following the procedure. Patient tolerance: Patient tolerated the procedure well with no immediate complications.   Medications Ordered in ED Medications  acetaminophen (TYLENOL) tablet 650 mg (650 mg Oral Given 10/28/18 1147)  bacitracin ointment (1 application Topical Given 10/28/18 1211)     Initial Impression / Assessment and Plan / ED Course  I have reviewed the triage vital signs and the nursing notes.  Pertinent labs & imaging results that were available during my care of the patient were reviewed  by me and considered in my medical decision making (see chart for details).  Patient presents to the ED w/ L knee injury which occurred at 1800 last night. Nontoxic appearing, vitals WNL. Exam w/ 1 cm laceration to the anterior L knee- Pressure irrigation performed. Wound explored and base of wound visualized in a bloodless field without evidence of foreign body. Tetanus is up to date. Given wound occurred 18 hours prior at this time feel that risks outweigh benefit of closure w/ small laceration- plan for healing by secondary intension- wound care in the ED & discussed with the patient.  No erythema/warmth/fever to indicate infectious process such as septic joint. Able to fully extend, & flex to about 90 degrees. Good strength with flexion/extension @ the knee & ankle. Xray negative for fx/dislocation. Will place in knee immobilizer with crutches per patient request. Orthopedics follow up. Recommended tylenol per OTC dosing, avoided NSAIDs secondary to lithium use.  I discussed results, treatment plan, need for follow-up, and return precautions with the patient. Provided opportunity for questions, patient confirmed understanding and is in agreement with plan.    Final Clinical Impressions(s) / ED Diagnoses   Final diagnoses:  Injury of  left knee, initial encounter    ED Discharge Orders    None       Desmond Lopeetrucelli, Telesha Deguzman R, PA-C 10/28/18 1215    Raeford RazorKohut, Stephen, MD 10/28/18 1247

## 2018-10-28 NOTE — Discharge Instructions (Signed)
Please read and follow all provided instructions.  You have been seen today for an injury to your left knee.   Tests performed today include: An x-ray of the affected area - does NOT show any broken bones or dislocations.  Vital signs. See below for your results today.   Home care instructions: -- *PRICE in the first 24-48 hours after injury: Protect (with brace, splint, sling), if given by your provider Rest Ice- Do not apply ice pack directly to your skin, place towel or similar between your skin and ice/ice pack. Apply ice for 20 min, then remove for 40 min while awake Compression- Wear brace, elastic bandage, splint as directed by your provider Elevate affected extremity above the level of your heart when not walking around for the first 24-48 hours    Wound- given your laceration occurred > 12 hours ago this was not closed with stitches & will need to heal on its own- please follow attached wound care guidelines.Be sure to keep area clean & dry. Wash it daily. Apply antibiotic ointment & bandage & be sure to change twice per day.   Medications:  - Naproxen is a nonsteroidal anti-inflammatory medication that will help with pain and swelling. Be sure to take this medication as prescribed with food, 1 pill every 12 hours,  It should be taken with food, as it can cause stomach upset, and more seriously, stomach bleeding. Do not take other nonsteroidal anti-inflammatory medications with this such as Advil, Motrin, Aleve, Mobic, Goodie Powder, or Motrin.    You make take Tylenol per over the counter dosing with these medications.   We have prescribed you new medication(s) today. Discuss the medications prescribed today with your pharmacist as they can have adverse effects and interactions with your other medicines including over the counter and prescribed medications. Seek medical evaluation if you start to experience new or abnormal symptoms after taking one of these medicines, seek care  immediately if you start to experience difficulty breathing, feeling of your throat closing, facial swelling, or rash as these could be indications of a more serious allergic reaction   Follow-up instructions: Please follow-up with your primary care provider or the provided orthopedic physician (bone specialist) if you continue to have significant pain in 1 week. In this case you may have a more severe injury that requires further care.   Return instructions:  Please return if your digits or extremity are numb or tingling, appear gray or blue, or you have severe pain (also elevate the extremity and loosen splint or wrap if you were given one) Please return if you have redness or fevers.  Please return to the Emergency Department if you experience worsening symptoms.  Please return if you have any other emergent concerns. Additional Information:  Your vital signs today were: BP 108/77 (BP Location: Right Arm)    Pulse 70    Temp 98.8 F (37.1 C) (Oral)    Resp 20    Ht 5\' 9"  (1.753 m)    Wt 68 kg    SpO2 98%    BMI 22.15 kg/m  If your blood pressure (BP) was elevated above 135/85 this visit, please have this repeated by your doctor within one month. ---------------

## 2018-10-28 NOTE — ED Notes (Signed)
Patient transported to X-ray 

## 2018-11-12 ENCOUNTER — Other Ambulatory Visit: Payer: Self-pay

## 2018-11-12 ENCOUNTER — Encounter (HOSPITAL_COMMUNITY): Payer: Self-pay | Admitting: Emergency Medicine

## 2018-11-12 DIAGNOSIS — Z590 Homelessness: Secondary | ICD-10-CM | POA: Diagnosis not present

## 2018-11-12 DIAGNOSIS — F17210 Nicotine dependence, cigarettes, uncomplicated: Secondary | ICD-10-CM | POA: Diagnosis not present

## 2018-11-12 DIAGNOSIS — T675XXA Heat exhaustion, unspecified, initial encounter: Secondary | ICD-10-CM | POA: Diagnosis not present

## 2018-11-12 DIAGNOSIS — R51 Headache: Secondary | ICD-10-CM | POA: Diagnosis present

## 2018-11-12 LAB — COMPREHENSIVE METABOLIC PANEL
ALT: 18 U/L (ref 0–44)
AST: 22 U/L (ref 15–41)
Albumin: 4 g/dL (ref 3.5–5.0)
Alkaline Phosphatase: 71 U/L (ref 38–126)
Anion gap: 10 (ref 5–15)
BUN: 15 mg/dL (ref 6–20)
CO2: 25 mmol/L (ref 22–32)
Calcium: 9 mg/dL (ref 8.9–10.3)
Chloride: 105 mmol/L (ref 98–111)
Creatinine, Ser: 0.89 mg/dL (ref 0.61–1.24)
GFR calc Af Amer: >60 mL/min (ref >60)
GFR calc non Af Amer: >60 mL/min (ref >60)
Glucose, Bld: 109 mg/dL — ABNORMAL HIGH (ref 70–99)
Potassium: 3.4 mmol/L — ABNORMAL LOW (ref 3.5–5.1)
Sodium: 140 mmol/L (ref 135–145)
Total Bilirubin: 0.7 mg/dL (ref 0.3–1.2)
Total Protein: 6.8 g/dL (ref 6.5–8.1)

## 2018-11-12 LAB — CBC
HCT: 41.5 % (ref 39.0–52.0)
Hemoglobin: 14 g/dL (ref 13.0–17.0)
MCH: 30.6 pg (ref 26.0–34.0)
MCHC: 33.7 g/dL (ref 30.0–36.0)
MCV: 90.8 fL (ref 80.0–100.0)
Platelets: 177 10*3/uL (ref 150–400)
RBC: 4.57 MIL/uL (ref 4.22–5.81)
RDW: 12 % (ref 11.5–15.5)
WBC: 7.3 10*3/uL (ref 4.0–10.5)
nRBC: 0 % (ref 0.0–0.2)

## 2018-11-12 LAB — LIPASE, BLOOD: Lipase: 23 U/L (ref 11–51)

## 2018-11-12 MED ORDER — SODIUM CHLORIDE 0.9% FLUSH
3.0000 mL | Freq: Once | INTRAVENOUS | Status: AC
  Administered 2018-11-13: 06:00:00 3 mL via INTRAVENOUS

## 2018-11-12 NOTE — ED Triage Notes (Signed)
Pt presents by [facility] for evaluation of headache and abdominal pain for the last several hours. Denies N/V/D.

## 2018-11-13 ENCOUNTER — Emergency Department (HOSPITAL_COMMUNITY)
Admission: EM | Admit: 2018-11-13 | Discharge: 2018-11-13 | Disposition: A | Discharge status: Home / Self Care | Payer: Medicaid Other | Attending: Emergency Medicine | Admitting: Emergency Medicine

## 2018-11-13 DIAGNOSIS — T675XXA Heat exhaustion, unspecified, initial encounter: Secondary | ICD-10-CM

## 2018-11-13 LAB — URINALYSIS, ROUTINE W REFLEX MICROSCOPIC
Bilirubin Urine: NEGATIVE
Glucose, UA: NEGATIVE mg/dL
Hgb urine dipstick: NEGATIVE
Ketones, ur: NEGATIVE mg/dL
Leukocytes,Ua: NEGATIVE
Nitrite: NEGATIVE
Protein, ur: NEGATIVE mg/dL
Specific Gravity, Urine: 1.008 (ref 1.005–1.030)
pH: 6 (ref 5.0–8.0)

## 2018-11-13 LAB — CK: Total CK: 249 U/L (ref 49–397)

## 2018-11-13 MED ORDER — POTASSIUM CHLORIDE CRYS ER 20 MEQ PO TBCR
40.0000 meq | EXTENDED_RELEASE_TABLET | Freq: Once | ORAL | Status: AC
  Administered 2018-11-13: 04:00:00 40 meq via ORAL
  Filled 2018-11-13: qty 2

## 2018-11-13 MED ORDER — SODIUM CHLORIDE 0.9 % IV BOLUS
1000.0000 mL | Freq: Once | INTRAVENOUS | Status: AC
  Administered 2018-11-13: 04:00:00 1000 mL via INTRAVENOUS

## 2018-11-13 MED ORDER — SODIUM CHLORIDE 0.9 % IV BOLUS
1000.0000 mL | Freq: Once | INTRAVENOUS | Status: AC
  Administered 2018-11-13: 06:00:00 1000 mL via INTRAVENOUS

## 2018-11-13 MED ORDER — ONDANSETRON HCL 4 MG/2ML IJ SOLN
4.0000 mg | Freq: Once | INTRAMUSCULAR | Status: AC
  Administered 2018-11-13: 4 mg via INTRAVENOUS
  Filled 2018-11-13: qty 2

## 2018-11-13 NOTE — ED Notes (Signed)
Pt stated they did not have the urge to void right now to provide urine specimen.

## 2018-11-13 NOTE — ED Provider Notes (Signed)
[facility] DEPT Provider Note   CSN: [id number] Arrival date & time: 11/12/18  2121  Time seen 3:02 AM  History   Chief Complaint Chief Complaint  Patient presents with  . Abdominal Pain  . Headache    HPI [name] is a 23 y.o. male.     HPI patient states he has been homeless for the past 3 years.  He states that on July 27 that he was out walking around in the heat and sweating a lot.  He states he did not eat or drink all day.  He states "my body is not acting right".  When asked what that means he states that he does not feel like walking.  He states he was sweating a lot, had nausea without vomiting.  He also complains of a diffuse throbbing headache.  He has had decreased urinary output.  He states he was having abdominal pain but that is gone now.  Please note we have had heat indexes over 100 degrees for the past week.  PCP Patient, No Pcp Per  Past Medical History:  Diagnosis Date  . Autism   . Bipolar 1 disorder ([place])   . Seizures ([place])    none since 23 years old    There are no active problems to display for this patient.   Past Surgical History:  Procedure Laterality Date  . TOOTH EXTRACTION          Home Medications    Prior to Admission medications   Medication Sig Start Date End Date Taking? Authorizing Provider  acetaminophen (TYLENOL) 325 MG tablet Take 2 tablets (650 mg total) by mouth every 6 (six) hours as needed. Patient taking differently: Take 650 mg by mouth every 6 (six) hours as needed for mild pain, moderate pain or headache.  10/05/17  Yes Leaphart, Zack Seal, PA-C  ibuprofen (ADVIL,MOTRIN) 600 MG tablet Take 1 tablet (600 mg total) by mouth every 6 (six) hours as needed. Patient not taking: Reported on 11/13/2018 10/05/17   Ocie Cornfield T, PA-C  ibuprofen (ADVIL,MOTRIN) 600 MG tablet Take 1 tablet (600 mg total) by mouth every 8 (eight) hours as needed. Patient not taking: Reported on 11/13/2018  12/24/17   Valarie Merino, MD  lithium carbonate 300 MG capsule Take 1 capsule (300 mg total) by mouth 2 (two) times daily with a meal. Patient not taking: Reported on 11/13/2018 12/05/15   Kerrie Buffalo, NP    Family History History reviewed. No pertinent family history.  Social History Social History   Tobacco Use  . Smoking status: Current Some Day Smoker    Packs/day: 2.00    Types: Cigarettes  . Smokeless tobacco: Never Used  . Tobacco comment: 4 packs a day  Substance Use Topics  . Alcohol use: No  . Drug use: No    Comment: "BD"- stated he took one time   homeless On disability for "mild MR"   Allergies   Other   Review of Systems Review of Systems  All other systems reviewed and are negative.    Physical Exam Updated Vital Signs BP 112/73 (BP Location: Right Arm)   Pulse (!) 52   Temp 97.9 F (36.6 C) (Oral)   Resp 15   Ht 5\' 9"  (1.753 m)   Wt 65.8 kg   SpO2 98%   BMI 21.41 kg/m   Physical Exam Vitals signs and nursing note reviewed.  Constitutional:      Appearance: Normal appearance.  Comments: Patient is ill kept  HENT:     Head: Normocephalic and atraumatic.     Right Ear: External ear normal.     Left Ear: External ear normal.     Nose: Nose normal.     Mouth/Throat:     Mouth: Mucous membranes are dry.     Pharynx: Oropharynx is clear.  Eyes:     Extraocular Movements: Extraocular movements intact.     Conjunctiva/sclera: Conjunctivae normal.     Pupils: Pupils are equal, round, and reactive to light.  Neck:     Musculoskeletal: Normal range of motion and neck supple.  Cardiovascular:     Rate and Rhythm: Normal rate and regular rhythm.     Heart sounds: Normal heart sounds.  Pulmonary:     Effort: Pulmonary effort is normal. No respiratory distress.     Breath sounds: Normal breath sounds.  Abdominal:     General: Abdomen is flat. Bowel sounds are normal.     Palpations: Abdomen is soft.     Tenderness: There is no  abdominal tenderness.  Musculoskeletal: Normal range of motion.  Skin:    General: Skin is warm and dry.     Capillary Refill: Capillary refill takes less than 2 seconds.  Neurological:     General: No focal deficit present.     Mental Status: He is alert and oriented to person, place, and time.     Cranial Nerves: No cranial nerve deficit.  Psychiatric:        Mood and Affect: Mood normal.        Behavior: Behavior normal.        Thought Content: Thought content normal.      ED Treatments / Results  Labs (all labs ordered are listed, but only abnormal results are displayed) Results for orders placed or performed during the hospital encounter of 11/13/18  Lipase, blood  Result Value Ref Range   Lipase 23 11 - 51 U/L  Comprehensive metabolic panel  Result Value Ref Range   Sodium 140 135 - 145 mmol/L   Potassium 3.4 (L) 3.5 - 5.1 mmol/L   Chloride 105 98 - 111 mmol/L   CO2 25 22 - 32 mmol/L   Glucose, Bld 109 (H) 70 - 99 mg/dL   BUN 15 6 - 20 mg/dL   Creatinine, Ser 1.610.89 0.61 - 1.24 mg/dL   Calcium 9.0 8.9 - 09.610.3 mg/dL   Total Protein 6.8 6.5 - 8.1 g/dL   Albumin 4.0 3.5 - 5.0 g/dL   AST 22 15 - 41 U/L   ALT 18 0 - 44 U/L   Alkaline Phosphatase 71 38 - 126 U/L   Total Bilirubin 0.7 0.3 - 1.2 mg/dL   GFR calc non Af Amer >60 >60 mL/min   GFR calc Af Amer >60 >60 mL/min   Anion gap 10 5 - 15  CBC  Result Value Ref Range   WBC 7.3 4.0 - 10.5 K/uL   RBC 4.57 4.22 - 5.81 MIL/uL   Hemoglobin 14.0 13.0 - 17.0 g/dL   HCT 04.541.5 40.939.0 - 81.152.0 %   MCV 90.8 80.0 - 100.0 fL   MCH 30.6 26.0 - 34.0 pg   MCHC 33.7 30.0 - 36.0 g/dL   RDW 91.412.0 78.211.5 - 95.615.5 %   Platelets 177 150 - 400 K/uL   nRBC 0.0 0.0 - 0.2 %  Urinalysis, Routine w reflex microscopic  Result Value Ref Range   Color, Urine YELLOW YELLOW  APPearance CLEAR CLEAR   Specific Gravity, Urine 1.008 1.005 - 1.030   pH 6.0 5.0 - 8.0   Glucose, UA NEGATIVE NEGATIVE mg/dL   Hgb urine dipstick NEGATIVE NEGATIVE    Bilirubin Urine NEGATIVE NEGATIVE   Ketones, ur NEGATIVE NEGATIVE mg/dL   Protein, ur NEGATIVE NEGATIVE mg/dL   Nitrite NEGATIVE NEGATIVE   Leukocytes,Ua NEGATIVE NEGATIVE  CK  Result Value Ref Range   Total CK 249 49 - 397 U/L   Laboratory interpretation all normal except mild hypokalemia    EKG None  Radiology No results found.  Procedures Procedures (including critical care time)  Medications Ordered in ED Medications  sodium chloride flush (NS) 0.9 % injection 3 mL (3 mLs Intravenous Given 11/13/18 0608)  sodium chloride 0.9 % bolus 1,000 mL (0 mLs Intravenous Stopped 11/13/18 0442)  sodium chloride 0.9 % bolus 1,000 mL (0 mLs Intravenous Stopped 11/13/18 0442)  potassium chloride SA (K-DUR) CR tablet 40 mEq (40 mEq Oral Given 11/13/18 0334)  ondansetron (ZOFRAN) injection 4 mg (4 mg Intravenous Given 11/13/18 0333)  sodium chloride 0.9 % bolus 1,000 mL (0 mLs Intravenous Stopped 11/13/18 0644)     Initial Impression / Assessment and Plan / ED Course  I have reviewed the triage vital signs and the nursing notes.  Pertinent labs & imaging results that were available during my care of the patient were reviewed by me and considered in my medical decision making (see chart for details).    I suspect patient has heat exhaustion and dehydration.  He was given IV fluids.  Laboratory testing was done.  His potassium was slightly low and he was given a dose orally in the ED.  Recheck at 5:30 AM patient sleeping in no distress.  When awakened he states he is feeling better, he however states he feels like he needs some more fluids.  He was given a # 3 L of normal saline.  Patient was discharged home after his IV fluids.  His urine that was submitted around 5 AM showed a low specific gravity.  Final Clinical Impressions(s) / ED Diagnoses   Final diagnoses:  Heat exhaustion, initial encounter    ED Discharge Orders    None      Plan discharge  Devoria AlbeIva Mackinze Criado, MD, Concha PyoFACEP     Galina Haddox, MD 11/13/18 [phone]

## 2018-11-13 NOTE — Discharge Instructions (Addendum)
You need to drink plenty of fluids, especially when it is this hot outside. Gatorade or sports drinks would be best.  Look at the resource guide to find a local shelter and also to get social services to help you find a place to live.

## 2018-11-13 NOTE — ED Notes (Signed)
Urine and culture sent to lab  

## 2018-11-13 NOTE — ED Notes (Signed)
Spoke with [employer] in lab, stated she would add CK on to lab draw.

## 2018-11-13 NOTE — ED Notes (Signed)
Pt aware that urine sample is needed.  

## 2018-11-13 NOTE — ED Notes (Signed)
Pt verbalized discharge instructions and follow up care. Alert and ambulatory. No IV.  

## 2019-02-03 ENCOUNTER — Encounter (HOSPITAL_COMMUNITY): Payer: Self-pay | Admitting: Emergency Medicine

## 2019-02-03 ENCOUNTER — Other Ambulatory Visit: Payer: Self-pay

## 2019-02-03 ENCOUNTER — Emergency Department (HOSPITAL_COMMUNITY)
Admission: EM | Admit: 2019-02-03 | Discharge: 2019-02-04 | Disposition: A | Discharge status: Home / Self Care | Payer: Medicaid Other | Attending: Emergency Medicine | Admitting: Emergency Medicine

## 2019-02-03 DIAGNOSIS — K0889 Other specified disorders of teeth and supporting structures: Secondary | ICD-10-CM | POA: Insufficient documentation

## 2019-02-03 DIAGNOSIS — F17210 Nicotine dependence, cigarettes, uncomplicated: Secondary | ICD-10-CM | POA: Diagnosis not present

## 2019-02-03 NOTE — ED Triage Notes (Signed)
Pt presents by [facility] for evaluation of right sided jaw pain that has been ongoing for the last few weeks.

## 2019-02-04 MED ORDER — PENICILLIN V POTASSIUM 500 MG PO TABS: 500.0000 mg | ORAL_TABLET | Freq: Four times a day (QID) | ORAL | 0 refills | Status: DC

## 2019-02-04 MED ORDER — NAPROXEN 500 MG PO TABS
500.0000 mg | ORAL_TABLET | Freq: Once | ORAL | Status: AC
  Administered 2019-02-04: 500 mg via ORAL
  Filled 2019-02-04: qty 1

## 2019-02-04 MED ORDER — NAPROXEN 500 MG PO TABS: 500.0000 mg | ORAL_TABLET | Freq: Two times a day (BID) | ORAL | 0 refills | Status: DC

## 2019-02-04 MED ORDER — PENICILLIN V POTASSIUM 500 MG PO TABS
500.0000 mg | ORAL_TABLET | Freq: Once | ORAL | Status: AC
  Administered 2019-02-04: 500 mg via ORAL
  Filled 2019-02-04: qty 1

## 2019-02-04 NOTE — ED Provider Notes (Signed)
[facility] DEPT Provider Note   CSN: [id number] Arrival date & time: 02/03/19  2246     History   Chief Complaint Chief Complaint  Patient presents with  . Dental Pain    HPI [name] is a 23 y.o. male.     The history is provided by the patient and medical records.  Dental Pain    23 year old male with history of autism, bipolar disorder, prior history of seizures, presenting to the ED for right lower dental pain.  Patient reports he has been having pain for about 3 weeks.  Pain worse with chewing on affected side.  States he does have some cold sensitivity.  Feels like he may have some swelling in the right cheek now.  He has not had any difficulty swallowing.  No fever or chills.  He is not currently established with a dentist.  No meds prior to arrival.  Past Medical History:  Diagnosis Date  . Autism   . Bipolar 1 disorder ([place])   . Seizures ([place])    none since 23 years old    There are no active problems to display for this patient.   Past Surgical History:  Procedure Laterality Date  . TOOTH EXTRACTION          Home Medications    Prior to Admission medications   Medication Sig Start Date End Date Taking? Authorizing Provider  acetaminophen (TYLENOL) 325 MG tablet Take 2 tablets (650 mg total) by mouth every 6 (six) hours as needed. Patient taking differently: Take 650 mg by mouth every 6 (six) hours as needed for mild pain, moderate pain or headache.  10/05/17   Leaphart, Zack Seal, PA-C  ibuprofen (ADVIL,MOTRIN) 600 MG tablet Take 1 tablet (600 mg total) by mouth every 6 (six) hours as needed. Patient not taking: Reported on 11/13/2018 10/05/17   Ocie Cornfield T, PA-C  ibuprofen (ADVIL,MOTRIN) 600 MG tablet Take 1 tablet (600 mg total) by mouth every 8 (eight) hours as needed. Patient not taking: Reported on 11/13/2018 12/24/17   Valarie Merino, MD  lithium carbonate 300 MG capsule Take 1 capsule (300 mg total) by  mouth 2 (two) times daily with a meal. Patient not taking: Reported on 11/13/2018 12/05/15   Kerrie Buffalo, NP    Family History History reviewed. No pertinent family history.  Social History Social History   Tobacco Use  . Smoking status: Current Some Day Smoker    Packs/day: 2.00    Types: Cigarettes  . Smokeless tobacco: Never Used  . Tobacco comment: 4 packs a day  Substance Use Topics  . Alcohol use: No  . Drug use: No    Comment: "BD"- stated he took one time      Allergies   Other   Review of Systems Review of Systems  HENT: Positive for dental problem.   All other systems reviewed and are negative.    Physical Exam Updated Vital Signs BP 119/70 (BP Location: Left Arm)   Pulse 82   Temp 98.5 F (36.9 C) (Oral)   Resp 18   Ht 5\' 9"  (1.753 m)   Wt 62.1 kg   SpO2 99%   BMI 20.23 kg/m   Physical Exam Vitals signs and nursing note reviewed.  Constitutional:      Appearance: He is well-developed.  HENT:     Head: Normocephalic and atraumatic.     Mouth/Throat:     Comments: Teeth largely in fair dentition, right lower molars  appear intact but there are some cavities noted, surrounding gingiva irritated but no discrete abscess, handling secretions appropriately, no trismus, no facial or neck swelling, normal phonation without stridor Eyes:     Conjunctiva/sclera: Conjunctivae normal.     Pupils: Pupils are equal, round, and reactive to light.  Neck:     Musculoskeletal: Normal range of motion.  Cardiovascular:     Rate and Rhythm: Normal rate and regular rhythm.     Heart sounds: Normal heart sounds.  Pulmonary:     Effort: Pulmonary effort is normal.     Breath sounds: Normal breath sounds.  Abdominal:     General: Bowel sounds are normal.     Palpations: Abdomen is soft.  Musculoskeletal: Normal range of motion.  Skin:    General: Skin is warm and dry.  Neurological:     Mental Status: He is alert and oriented to person, place, and time.       ED Treatments / Results  Labs (all labs ordered are listed, but only abnormal results are displayed) Labs Reviewed - No data to display  EKG None  Radiology No results found.  Procedures Procedures (including critical care time)  Medications Ordered in ED Medications  penicillin v potassium (VEETID) tablet 500 mg (500 mg Oral Given 02/04/19 0025)  naproxen (NAPROSYN) tablet 500 mg (500 mg Oral Given 02/04/19 0025)     Initial Impression / Assessment and Plan / ED Course  I have reviewed the triage vital signs and the nursing notes.  Pertinent labs & imaging results that were available during my care of the patient were reviewed by me and considered in my medical decision making (see chart for details).  23 year old male who with right lower dental pain for the past 3 weeks.  Feels like he may be having some swelling in the right cheek now.  Does have pain with chewing and some cold sensitivity.  He is afebrile nontoxic.  No significant facial swelling observed.  Right lower molars are intact but there are some cavities noted.  He does not have any noted discrete abscess.  Handling stations well, normal phonation without stridor.  No signs or symptoms suggestive of Ludwig's angina.  Will start on antibiotics and refer him to dentist.  He may return here for any new or acute changes.  Final Clinical Impressions(s) / ED Diagnoses   Final diagnoses:  Pain, dental    ED Discharge Orders         Ordered    penicillin v potassium (VEETID) 500 MG tablet  4 times daily     02/04/19 0045    naproxen (NAPROSYN) 500 MG tablet  2 times daily with meals     02/04/19 0045           Garlon Hatchet, PA-C 02/04/19 7408    Marily Memos, MD 02/04/19 0127

## 2019-02-04 NOTE — Discharge Instructions (Signed)
Take the prescribed medication as directed. Follow-up with dentist-- Dr. Haig Prophet is on call, I would contact his office in the morning for appt. Return to the ED for new or worsening symptoms.

## 2019-02-11 ENCOUNTER — Encounter (HOSPITAL_COMMUNITY): Payer: Self-pay | Admitting: Emergency Medicine

## 2019-02-11 ENCOUNTER — Other Ambulatory Visit: Payer: Self-pay

## 2019-02-11 ENCOUNTER — Emergency Department (HOSPITAL_COMMUNITY)
Admission: EM | Admit: 2019-02-11 | Discharge: 2019-02-12 | Disposition: A | Discharge status: Home / Self Care | Payer: Medicaid Other | Attending: Emergency Medicine | Admitting: Emergency Medicine

## 2019-02-11 DIAGNOSIS — R45851 Suicidal ideations: Secondary | ICD-10-CM | POA: Insufficient documentation

## 2019-02-11 DIAGNOSIS — F4325 Adjustment disorder with mixed disturbance of emotions and conduct: Secondary | ICD-10-CM

## 2019-02-11 DIAGNOSIS — F319 Bipolar disorder, unspecified: Secondary | ICD-10-CM | POA: Insufficient documentation

## 2019-02-11 DIAGNOSIS — F17210 Nicotine dependence, cigarettes, uncomplicated: Secondary | ICD-10-CM | POA: Insufficient documentation

## 2019-02-11 DIAGNOSIS — F1210 Cannabis abuse, uncomplicated: Secondary | ICD-10-CM | POA: Diagnosis not present

## 2019-02-11 DIAGNOSIS — Z79899 Other long term (current) drug therapy: Secondary | ICD-10-CM | POA: Insufficient documentation

## 2019-02-11 DIAGNOSIS — F339 Major depressive disorder, recurrent, unspecified: Secondary | ICD-10-CM | POA: Insufficient documentation

## 2019-02-11 DIAGNOSIS — Z046 Encounter for general psychiatric examination, requested by authority: Secondary | ICD-10-CM | POA: Diagnosis present

## 2019-02-11 NOTE — ED Triage Notes (Signed)
Pt presents for evaluation of suicidal ideation with plan on cutting wrists.

## 2019-02-12 ENCOUNTER — Encounter (HOSPITAL_COMMUNITY): Payer: Self-pay | Admitting: Registered Nurse

## 2019-02-12 DIAGNOSIS — F4325 Adjustment disorder with mixed disturbance of emotions and conduct: Secondary | ICD-10-CM

## 2019-02-12 LAB — CBC
HCT: 42 % (ref 39.0–52.0)
Hemoglobin: 14.4 g/dL (ref 13.0–17.0)
MCH: 30.6 pg (ref 26.0–34.0)
MCHC: 34.3 g/dL (ref 30.0–36.0)
MCV: 89.2 fL (ref 80.0–100.0)
Platelets: 197 10*3/uL (ref 150–400)
RBC: 4.71 MIL/uL (ref 4.22–5.81)
RDW: 12 % (ref 11.5–15.5)
WBC: 8.2 10*3/uL (ref 4.0–10.5)
nRBC: 0 % (ref 0.0–0.2)

## 2019-02-12 LAB — COMPREHENSIVE METABOLIC PANEL
ALT: 19 U/L (ref 0–44)
AST: 18 U/L (ref 15–41)
Albumin: 4 g/dL (ref 3.5–5.0)
Alkaline Phosphatase: 64 U/L (ref 38–126)
Anion gap: 8 (ref 5–15)
BUN: 13 mg/dL (ref 6–20)
CO2: 27 mmol/L (ref 22–32)
Calcium: 9.4 mg/dL (ref 8.9–10.3)
Chloride: 104 mmol/L (ref 98–111)
Creatinine, Ser: 0.72 mg/dL (ref 0.61–1.24)
GFR calc Af Amer: >60 mL/min (ref >60)
GFR calc non Af Amer: >60 mL/min (ref >60)
Glucose, Bld: 95 mg/dL (ref 70–99)
Potassium: 3.6 mmol/L (ref 3.5–5.1)
Sodium: 139 mmol/L (ref 135–145)
Total Bilirubin: 0.6 mg/dL (ref 0.3–1.2)
Total Protein: 6.7 g/dL (ref 6.5–8.1)

## 2019-02-12 LAB — RAPID URINE DRUG SCREEN, HOSP PERFORMED
Amphetamines: NOT DETECTED
Barbiturates: NOT DETECTED
Benzodiazepines: NOT DETECTED
Cocaine: NOT DETECTED
Opiates: NOT DETECTED
Tetrahydrocannabinol: POSITIVE — AB

## 2019-02-12 LAB — SALICYLATE LEVEL: Salicylate Lvl: <7 mg/dL (ref 2.8–30.0)

## 2019-02-12 LAB — ETHANOL: Alcohol, Ethyl (B): <10 mg/dL (ref <10)

## 2019-02-12 LAB — ACETAMINOPHEN LEVEL: Acetaminophen (Tylenol), Serum: <10 ug/mL — ABNORMAL LOW (ref 10–30)

## 2019-02-12 MED ORDER — OLANZAPINE 10 MG PO TABS: 10.0000 mg | ORAL_TABLET | Freq: Every day | ORAL | Status: DC

## 2019-02-12 MED ORDER — BENZTROPINE MESYLATE 1 MG PO TABS: 1.0000 mg | ORAL_TABLET | Freq: Every day | ORAL | Status: DC

## 2019-02-12 MED ORDER — TRAZODONE HCL 100 MG PO TABS: 100.0000 mg | ORAL_TABLET | Freq: Every day | ORAL | Status: DC

## 2019-02-12 MED ORDER — GUANFACINE HCL ER 1 MG PO TB24: 1.0000 mg | ORAL_TABLET | Freq: Every day | ORAL | Status: DC
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
## 2019-02-12 NOTE — BH Assessment (Signed)
[facility] Assessment Progress Note  Per Shuvon Rankin, FNP, this pt does not require psychiatric hospitalization at this time.  Pt is to be discharged from [facility] with recommendation to follow up with [facility], where he reportedly has an appointment scheduled on Friday, 02/15/2019.  This has been included in pt's discharge instructions.  Pt's nurse has been notified.  Jalene Mullet, [place] Triage Specialist [phone]

## 2019-02-12 NOTE — ED Notes (Signed)
ED Provider at bedside. 

## 2019-02-12 NOTE — Discharge Instructions (Signed)
For your mental health needs, you are advised to follow up with [facility].  Plan to keep your previously scheduled appointment:       [place]. [address]      [place], [place] [address]      [phone]      Crisis number: [phone]
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
## 2019-02-12 NOTE — ED Notes (Signed)
Lindon Romp, NP, recommends overnight observation for safety and stabilization with psych re evaluation in the morning.

## 2019-02-12 NOTE — ED Notes (Signed)
Tele-psych assessment at bedside

## 2019-02-12 NOTE — Consult Note (Addendum)
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 Parke Poisson; and chart reviewed on 02/12/19.  On evaluation Andrew [name] reports " I get aggravated yesterday and I felt like I wanted to kill myself.  I thought about using a knife and then I changed my mind."  Patient states that he does not want to die states that it was just the thought when he was angry.  Reports that his stresses were being bullied by anybody and everybody.  Patient states that he is homeless but denies the use of drugs or alcohol.  Patient denies prior suicide attempt.  Patient states that he does have outpatient psychiatric services with [facility] and his next appointment is this coming Friday at 3 PM.  Patient also states that he is compliant with his medications (Cogentin, trazodone, Zyprexa, and Intuniv).  Patient also reports that he goes to the [facility] for showers, telephone use, and Education officer, museum. During evaluation Andrew [name] is [name]/oriented x 4; calm/cooperative; and mood is congruent with affect.  He does not appear to be responding to internal/external stimuli or delusional thoughts.  Patient denies suicidal/self-harm/homicidal ideation, psychosis, and paranoia.  Patient answered question appropriately.     Total Time spent with patient: 30 minutes  Past Psychiatric History: See below  Past Medical History:  Past Medical History:  Diagnosis Date  . Autism   . Bipolar 1 disorder ([place])   . Seizures ([place])    none since 23 years old    Past Surgical History:  Procedure Laterality Date  . TOOTH EXTRACTION     Family History: History reviewed. No pertinent family history. Family Psychiatric   History: Unaware Social History:  Social History   Substance and Sexual Activity  Alcohol Use No     Social History   Substance and Sexual Activity  Drug Use No   Comment: "BD"- stated he took one time     Social History   Socioeconomic History  . Marital status: Single    Spouse name: Not on file  . Number of children: Not on file  . Years of education: Not on file  . Highest education level: Not on file  Occupational History  . Not on file  Social Needs  . Financial resource strain: Not on file  . Food insecurity    Worry: Not on file    Inability: Not on file  . Transportation needs    Medical: Not on file    Non-medical: Not on file  Tobacco Use  . Smoking status: Current Some Day Smoker    Packs/day: 2.00    Types: Cigarettes  . Smokeless tobacco: Never Used  . Tobacco comment: 4 packs a day  Substance and Sexual Activity  . Alcohol use: No  . Drug use: No    Comment: "BD"- stated he took one time   . Sexual activity: Yes  Lifestyle  . Physical activity    Days per week: Not on file    Minutes per session: Not on file  . Stress: Not on file  Relationships  . Social Herbalist on phone: Not on file    Gets together: Not on file    Attends religious service:  Not on file    Active member of club or organization: Not on file    Attends meetings of clubs or organizations: Not on file    Relationship status: Not on file  Other Topics Concern  . Not on file  Social History Narrative  . Not on file    Has this patient used any form of tobacco in the last 30 days? (Cigarettes, Smokeless Tobacco, Cigars, and/or Pipes) A prescription for an FDA-approved tobacco cessation medication was offered at discharge and the patient refused  Current Medications: Current Facility-Administered Medications  Medication Dose Route Frequency Provider Last Rate Last Dose  . benztropine (COGENTIN) tablet 1 mg  1 mg Oral QHS Knapp, Iva, MD      . guanFACINE  (INTUNIV) ER tablet 1 mg  1 mg Oral QHS Lynelle Doctor, Iva, MD      . OLANZapine (ZYPREXA) tablet 10 mg  10 mg Oral QHS Devoria Albe, MD      . traZODone (DESYREL) tablet 100 mg  100 mg Oral QHS Devoria Albe, MD       Current Outpatient Medications  Medication Sig Dispense Refill  . benztropine (COGENTIN) 1 MG tablet Take 1 mg by mouth at bedtime.    Marland Kitchen guanFACINE (INTUNIV) 1 MG TB24 ER tablet Take 1 mg by mouth at bedtime.    Marland Kitchen OLANZapine (ZYPREXA) 10 MG tablet Take 10 mg by mouth at bedtime.    . traZODone (DESYREL) 100 MG tablet Take 100 mg by mouth at bedtime.    . naproxen (NAPROSYN) 500 MG tablet Take 1 tablet (500 mg total) by mouth 2 (two) times daily with a meal. 30 tablet 0  . penicillin v potassium (VEETID) 500 MG tablet Take 1 tablet (500 mg total) by mouth 4 (four) times daily. 40 tablet 0   PTA Medications: (Not in a hospital admission)   Musculoskeletal: Strength & Muscle Tone: within normal limits Gait & Station: normal Patient leans: N/A  Psychiatric Specialty Exam: Physical Exam  Nursing note and vitals reviewed. Constitutional: He is oriented to person, place, and time. He appears well-developed and well-nourished. No distress.  Neck: Normal range of motion.  Respiratory: Effort normal.  Musculoskeletal: Normal range of motion.  Neurological: He is [name] and oriented to person, place, and time.  Psychiatric: He has a normal mood and affect. His speech is normal and behavior is normal. Judgment and thought content normal. Cognition and memory are normal.    Review of Systems  Psychiatric/Behavioral: Depression: Stable. Hallucinations:  Denies. Memory loss:  Denies. Substance abuse:  Denies. Suicidal ideas:  Denies. Nervous/anxious:  Stable. Insomnia:  Denies.   All other systems reviewed and are negative.   Blood pressure 103/63, pulse 78, temperature 97.7 F (36.5 C), temperature source Oral, resp. rate 18, height 5\' 9"  (1.753 m), weight 61.2 kg, SpO2 100 %.Body mass index  is 19.94 kg/m.  General Appearance: Casual  Eye Contact:  Good  Speech:  Clear and Coherent and Normal Rate  Volume:  Normal  Mood:  "Fine ready to go" appropriate  Affect:  Appropriate and Congruent  Thought Process:  Coherent, Goal Directed and Descriptions of Associations: Intact  Orientation:  Full (Time, Place, and Person)  Thought Content:  WDL  Suicidal Thoughts:  No  Homicidal Thoughts:  No  Memory:  Immediate;   Good Recent;   Good  Judgement:  Intact  Insight:  Good  Psychomotor Activity:  Normal  Concentration:  Concentration: Good and Attention Span: Good  Recall:  Good  Fund of Knowledge:  Fair  Language:  Good  Akathisia:  No  Handed:  Right  AIMS (if indicated):     Assets:  Communication Skills Desire for Improvement  ADL's:  Intact  Cognition:  WNL  Sleep:        Demographic Factors:  Male and Caucasian  Loss Factors: Financial problems/change in socioeconomic status  Historical Factors: NA  Risk Reduction Factors:   Religious beliefs about death and Positive therapeutic relationship  Continued Clinical Symptoms:  Alcohol/Substance Abuse/Dependencies Previous Psychiatric Diagnoses and Treatments  Cognitive Features That Contribute To Risk:  None    Suicide Risk:  Minimal: No identifiable suicidal ideation.  Patients presenting with no risk factors but with morbid ruminations; may be classified as minimal risk based on the severity of the depressive symptoms    Plan Of Care/Follow-up recommendations:  Activity:  As tolerated Diet:  Heart healthy Other:  [facility]  Disposition: Patient psychiatrically cleared No evidence of imminent risk to self or others at present.   Patient does not meet criteria for psychiatric inpatient admission. Supportive therapy provided about ongoing stressors. Discussed crisis plan, support from social network, calling 911, coming to the Emergency Department, and  calling Suicide Hotline.  Andrew Rankin, NP 02/12/2019, 11:19 AM   Attest to NP note

## 2019-03-29 ENCOUNTER — Encounter (HOSPITAL_COMMUNITY): Payer: Self-pay

## 2019-03-29 ENCOUNTER — Emergency Department (HOSPITAL_COMMUNITY)
Admission: EM | Admit: 2019-03-29 | Discharge: 2019-03-29 | Disposition: A | Discharge status: Home / Self Care | Payer: Medicaid Other | Attending: Emergency Medicine | Admitting: Emergency Medicine

## 2019-03-29 DIAGNOSIS — X31XXXA Exposure to excessive natural cold, initial encounter: Secondary | ICD-10-CM | POA: Insufficient documentation

## 2019-03-29 DIAGNOSIS — T699XXA Effect of reduced temperature, unspecified, initial encounter: Secondary | ICD-10-CM | POA: Diagnosis not present

## 2019-03-29 DIAGNOSIS — Z590 Homelessness: Secondary | ICD-10-CM | POA: Insufficient documentation

## 2019-03-29 DIAGNOSIS — Z79899 Other long term (current) drug therapy: Secondary | ICD-10-CM | POA: Insufficient documentation

## 2019-03-29 DIAGNOSIS — F17210 Nicotine dependence, cigarettes, uncomplicated: Secondary | ICD-10-CM | POA: Insufficient documentation

## 2019-03-29 DIAGNOSIS — F840 Autistic disorder: Secondary | ICD-10-CM | POA: Diagnosis not present

## 2019-03-29 DIAGNOSIS — M79674 Pain in right toe(s): Secondary | ICD-10-CM | POA: Diagnosis present

## 2019-03-29 NOTE — ED Triage Notes (Signed)
Pt comes via [name], comes for toe pain of all his toes because it is cold outside and he is homeless. No injuries

## 2019-03-29 NOTE — ED Notes (Signed)
Pt ambulated to bathroom w/o difficulty.

## 2019-03-29 NOTE — Discharge Instructions (Addendum)
Return for chest pain, shortness of breath, numbness or paralysis of extremities, seizures   Potassium levels were reviewed, they were minimally low once in 2018 and normal since

## 2019-03-29 NOTE — ED Notes (Signed)
RN went over dc instructions with pt who verbalized understanding. Pt was alert and no distress was noted when ambulated to exit.  

## 2019-03-29 NOTE — ED Notes (Signed)
PA at bedside.
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
## 2019-03-29 NOTE — ED Notes (Signed)
Pt given hot chocolate and socks at this time.

## 2019-03-31 ENCOUNTER — Encounter (HOSPITAL_COMMUNITY): Payer: Self-pay | Admitting: *Deleted

## 2019-03-31 ENCOUNTER — Emergency Department (HOSPITAL_COMMUNITY): Payer: Medicaid Other

## 2019-03-31 ENCOUNTER — Emergency Department (HOSPITAL_COMMUNITY)
Admission: EM | Admit: 2019-03-31 | Discharge: 2019-04-01 | Disposition: A | Discharge status: Home / Self Care | Payer: Medicaid Other | Attending: Emergency Medicine | Admitting: Emergency Medicine

## 2019-03-31 DIAGNOSIS — F319 Bipolar disorder, unspecified: Secondary | ICD-10-CM | POA: Diagnosis not present

## 2019-03-31 DIAGNOSIS — F840 Autistic disorder: Secondary | ICD-10-CM | POA: Insufficient documentation

## 2019-03-31 DIAGNOSIS — R0789 Other chest pain: Secondary | ICD-10-CM | POA: Diagnosis present

## 2019-03-31 DIAGNOSIS — F17210 Nicotine dependence, cigarettes, uncomplicated: Secondary | ICD-10-CM | POA: Diagnosis not present

## 2019-03-31 DIAGNOSIS — R0781 Pleurodynia: Secondary | ICD-10-CM | POA: Diagnosis not present

## 2019-03-31 LAB — CBC
HCT: 42.8 % (ref 39.0–52.0)
Hemoglobin: 14.9 g/dL (ref 13.0–17.0)
MCH: 31 pg (ref 26.0–34.0)
MCHC: 34.8 g/dL (ref 30.0–36.0)
MCV: 89.2 fL (ref 80.0–100.0)
Platelets: 179 K/uL (ref 150–400)
RBC: 4.8 MIL/uL (ref 4.22–5.81)
RDW: 11.9 % (ref 11.5–15.5)
WBC: 8.5 K/uL (ref 4.0–10.5)
nRBC: 0 % (ref 0.0–0.2)

## 2019-03-31 MED ORDER — SODIUM CHLORIDE 0.9% FLUSH: 3.0000 mL | Freq: Once | INTRAVENOUS | Status: DC

## 2019-03-31 NOTE — ED Triage Notes (Signed)
Pt was brought in by [facility],  He states that he called [facility] due to left rib pain which began about 4 hours ago.  Pt denies any trauma or URI, no cough.  Pt states that he is homeless and is staying on the streets.  Pt is alert and oriented.

## 2019-03-31 NOTE — ED Notes (Signed)
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 Pain increases with breathing.

## 2019-04-01 LAB — BASIC METABOLIC PANEL
Anion gap: 9 (ref 5–15)
BUN: 15 mg/dL (ref 6–20)
CO2: 27 mmol/L (ref 22–32)
Calcium: 9.4 mg/dL (ref 8.9–10.3)
Chloride: 105 mmol/L (ref 98–111)
Creatinine, Ser: 0.71 mg/dL (ref 0.61–1.24)
GFR calc Af Amer: >60 mL/min (ref >60)
GFR calc non Af Amer: >60 mL/min (ref >60)
Glucose, Bld: 95 mg/dL (ref 70–99)
Potassium: 3.9 mmol/L (ref 3.5–5.1)
Sodium: 141 mmol/L (ref 135–145)

## 2019-04-01 LAB — TROPONIN I (HIGH SENSITIVITY): Troponin I (High Sensitivity): 5 ng/L (ref <18)

## 2019-04-01 MED ORDER — NAPROXEN 250 MG PO TABS
500.0000 mg | ORAL_TABLET | Freq: Once | ORAL | Status: AC
  Administered 2019-04-01: 02:00:00 500 mg via ORAL
  Filled 2019-04-01: qty 2

## 2019-04-01 MED ORDER — NAPROXEN 500 MG PO TABS: 500.0000 mg | ORAL_TABLET | Freq: Two times a day (BID) | ORAL | 0 refills | Status: DC

## 2019-04-01 NOTE — Discharge Instructions (Signed)
Your assessment in the emergency department today was reassuring.  Apply ice to areas of pain to limit inflammation.  Take naproxen as prescribed.  Try to avoid strenuous activity and heavy lifting as this may prolong your pain.  Return for any new or concerning symptoms.

## 2019-04-01 NOTE — ED Provider Notes (Signed)
[facility] EMERGENCY DEPARTMENT Provider Note   CSN: [id number] Arrival date & time: 03/31/19  2135     History Chief Complaint  Patient presents with  . Chest Pain    [name] is a 23 y.o. male.   The history is provided by the patient. No language interpreter was used.  Chest Pain Chest pain location: under bilateral ribs. Pain quality: sharp   Pain radiates to:  Does not radiate Pain severity:  Mild Onset quality:  Sudden Duration:  1 day Timing:  Intermittent Progression:  Unchanged Chronicity:  New Context comment:  Began after heavy lifting Relieved by:  Rest Worsened by:  Deep breathing and movement Associated symptoms: no cough, no fever, no lower extremity edema, no near-syncope, no shortness of breath and no syncope   Risk factors: male sex        Past Medical History:  Diagnosis Date  . Autism   . Bipolar 1 disorder (HCC)   . Seizures (HCC)    none since 23 years old    Patient Active Problem List   Diagnosis Date Noted  . 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 TOOTH EXTRACTION         No family history on file.  Social History   Tobacco Use  . Smoking status: Current Some Day Smoker    Packs/day: 2.00    Types: Cigarettes  . Smokeless tobacco: Never Used  . Tobacco comment: 4 packs a day  Substance Use Topics  . Alcohol use: No  . Drug use: No    Comment: "BD"- stated he took one time     Home Medications Prior to Admission medications   Medication Sig Start Date End Date Taking? Authorizing Provider  naproxen (NAPROSYN) 500 MG tablet Take 1 tablet (500 mg total) by mouth 2 (two) times daily with a meal. 04/01/19   Antony Madura, PA-C  penicillin v potassium (VEETID) 500 MG tablet Take 1 tablet (500 mg total) by mouth 4 (four) times daily. Patient not taking: Reported on 04/01/2019 02/04/19   Garlon Hatchet, PA-C    Allergies    Other  Review of Systems   Review of Systems  Constitutional: Negative for fever.  Respiratory: Negative for cough and shortness of breath.   Cardiovascular: Positive for chest pain. Negative for syncope and near-syncope.  Ten systems reviewed and are negative for acute change, except as noted in the HPI.    Physical Exam Updated Vital Signs BP 121/75 (BP Location: Right Arm)   Pulse 75   Temp 98.1 F (36.7 C) (Oral)   Resp 18   Wt 72.6 kg   SpO2 99%   BMI 23.63 kg/m   Physical Exam Vitals and nursing note reviewed.  Constitutional:      General: He is not in acute distress.    Appearance: He is well-developed. He is not diaphoretic.     Comments: Nontoxic appearing and in NAD  HENT:     Head: Normocephalic and atraumatic.  Eyes:     General: No scleral icterus.    Conjunctiva/sclera: Conjunctivae normal.  Cardiovascular:     Rate and Rhythm: Normal rate and regular rhythm.     Pulses: Normal pulses.  Pulmonary:     Effort: Pulmonary effort is normal. No respiratory distress.     Breath sounds: No stridor. No wheezing, rhonchi or rales.  Comments: Respirations even and unlabored. Lungs CTAB. No crepitus or chest wall TTP. Abdominal:     Palpations: Abdomen is soft.     Tenderness: There is no guarding.     Comments: Soft, nontender, nondistended abdomen  Musculoskeletal:        General: Normal range of motion.     Cervical back: Normal range of motion.  Skin:    General: Skin is warm and dry.     Coloration: Skin is not pale.     Findings: No erythema or rash.  Neurological:     Mental Status: He is alert and oriented to person, place, and time.     Coordination: Coordination normal.     Comments: Moving all extremities spontaneously.  Psychiatric:        Behavior: Behavior normal.     ED Results / Procedures / Treatments   Labs (all labs ordered are listed, but only abnormal results are displayed) Labs Reviewed   BASIC METABOLIC PANEL  CBC  TROPONIN I (HIGH SENSITIVITY)    EKG EKG Interpretation  Date/Time:  Sunday March 31 2019 21:43:22 EST Ventricular Rate:  71 PR Interval:  150 QRS Duration: 86 QT Interval:  354 QTC Calculation: 384 R Axis:   87 Text Interpretation: Normal sinus rhythm ST elevation, consider early repolarization Borderline ECG No significant change since last tracing Confirmed by Floyd, Dan ([id number]) on 04/01/2019 1:55:15 AM   Radiology DG Chest 2 View  Result Date: 03/31/2019 CLINICAL DATA:  Left-sided rib pain and shortness of breath EXAM: CHEST - 2 VIEW COMPARISON:  09/30/2018 FINDINGS: Cardiac shadows within normal limits. The lungs are well aerated bilaterally. No focal infiltrate or sizable effusion is seen. No bony abnormality is noted. IMPRESSION: No active cardiopulmonary disease. Electronically Signed   By: Mark  Lukens M.D.   On: 03/31/2019 23:15    Procedures Procedures (including critical care time)  Medications Ordered in ED Medications  sodium chloride flush (NS) 0.9 % injection 3 mL (3 mLs Intravenous Not Given 04/01/19 0230)  naproxen (NAPROSYN) tablet 500 mg (500 mg Oral Given 04/01/19 0229)    ED Course  I have reviewed the triage vital signs and the nursing notes.  Pertinent labs & imaging results that were available during my care of the patient were reviewed by me and considered in my medical decision making (see chart for details).    MDM Rules/Calculators/A&P   23 year old male presents to the emergency department for complaints of pleuritic chest pain after heavy lifting.  Likely musculoskeletal etiology with reassuring ED evaluation.  No shortness of breath, hypoxia.  Lungs are clear to auscultation bilaterally.  No crepitus to palpation of the chest wall.  PERC negative.  Counseled on use of NSAIDs as well as icing.  Advised patient to refrain from strenuous activity over the next few days.  Return precautions discussed and provided.  Patient discharged in stable condition with no unaddressed concerns.   Final Clinical Impression(s) / ED Diagnoses Final diagnoses:  Pleuritic chest pain    Rx / DC Orders ED Discharge Orders         Ordered    naproxen (NAPROSYN) 500 MG tablet  2 times daily with meals,   Status:  Discontinued     04/01/19 0205    naproxen (NAPROSYN) 500 MG tablet  2 times daily with meals     12 /14/20 0205           Antonietta Breach, PA-C 04/01/19 [place], DO  04/01/19 0719  

## 2019-04-13 ENCOUNTER — Other Ambulatory Visit: Payer: Self-pay

## 2019-04-13 ENCOUNTER — Emergency Department (HOSPITAL_COMMUNITY)
Admission: EM | Admit: 2019-04-13 | Discharge: 2019-04-13 | Disposition: A | Discharge status: Home / Self Care | Payer: Medicaid Other | Attending: Emergency Medicine | Admitting: Emergency Medicine

## 2019-04-13 DIAGNOSIS — R112 Nausea with vomiting, unspecified: Secondary | ICD-10-CM | POA: Insufficient documentation

## 2019-04-13 DIAGNOSIS — R109 Unspecified abdominal pain: Secondary | ICD-10-CM | POA: Diagnosis not present

## 2019-04-13 DIAGNOSIS — Z5321 Procedure and treatment not carried out due to patient leaving prior to being seen by health care provider: Secondary | ICD-10-CM | POA: Insufficient documentation

## 2019-04-13 NOTE — ED Triage Notes (Signed)
Per ems, patient has n/v starting 6 hours ago. Patient states he ate all day yesterday and now it is causing this today. Patient denies any exposure to covid. Patient has hx of schizophrenia and autism. Lives in group home -off of [address] but will not disclose address. Patient states he has not taken his medication in a while. Patient reports pain in mid abdominal area due to "vomiting". Ems reports patient is becoming a little agitated.

## 2019-04-15 ENCOUNTER — Other Ambulatory Visit: Payer: Self-pay

## 2019-04-15 ENCOUNTER — Encounter (HOSPITAL_COMMUNITY): Payer: Self-pay | Admitting: Emergency Medicine

## 2019-04-15 ENCOUNTER — Emergency Department (HOSPITAL_COMMUNITY)
Admission: EM | Admit: 2019-04-15 | Discharge: 2019-04-15 | Disposition: A | Discharge status: Home / Self Care | Payer: Medicaid Other | Attending: Emergency Medicine | Admitting: Emergency Medicine

## 2019-04-15 DIAGNOSIS — T730XXA Starvation, initial encounter: Secondary | ICD-10-CM | POA: Insufficient documentation

## 2019-04-15 DIAGNOSIS — F17210 Nicotine dependence, cigarettes, uncomplicated: Secondary | ICD-10-CM | POA: Diagnosis not present

## 2019-04-15 DIAGNOSIS — F840 Autistic disorder: Secondary | ICD-10-CM | POA: Insufficient documentation

## 2019-04-15 DIAGNOSIS — Z791 Long term (current) use of non-steroidal anti-inflammatories (NSAID): Secondary | ICD-10-CM | POA: Diagnosis not present

## 2019-04-15 NOTE — ED Triage Notes (Addendum)
Pt BIB [facility], states he hasn't had anything to eat and drink since leaving the house yesterday. No other complaints at this time. EMS CBG 130

## 2019-04-15 NOTE — ED Notes (Signed)
Pt provided with cup of water and graham crackers per Claiborne Bill[place], [place].

## 2019-04-15 NOTE — ED Provider Notes (Signed)
[name] EMERGENCY DEPARTMENT Provider Note   CSN: [id number] Arrival date & time: 04/15/19  0044     History Chief Complaint  Patient presents with  . Hungry    [name] is a 23 y.o. male.  23 year old male with a history of autism, bipolar 1 disorder, seizures presents to the emergency department complaining that he is hungry.  He states that he has not had anything to eat or drink since yesterday morning.  This has made him feel weak.  He called EMS to be transported to the hospital.  CBG was approximately 130 with EMS.  Patient states that he did not eat or drink because he "did not think about it".  He has no additional complaints at this time.  The history is provided by the patient. No language interpreter was used.       Past Medical History:  Diagnosis Date  . Autism   . Bipolar 1 disorder (HCC)   . Seizures (HCC)    none since 23 years old    Patient Active Problem List   Diagnosis Date Noted  . 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 TOOTH EXTRACTION         No family history on file.  Social History   Tobacco Use  . Smoking status: Current Some Day Smoker    Packs/day: 2.00    Types: Cigarettes  . Smokeless tobacco: Never Used  . Tobacco comment: 4 packs a day  Substance Use Topics  . Alcohol use: No  . Drug use: No    Comment: "BD"- stated he took one time     Home Medications Prior to Admission medications   Medication Sig Start Date End Date Taking? Authorizing Provider  naproxen (NAPROSYN) 500 MG tablet Take 1 tablet (500 mg total) by mouth 2 (two) times daily with a meal. 04/01/19   Antony Madura, PA-C  penicillin v potassium (VEETID) 500 MG tablet Take 1 tablet (500 mg total) by mouth 4 (four) times daily. Patient not taking: Reported on 04/01/2019 02/04/19   Garlon Hatchet, PA-C    Allergies    Other   Review of Systems   Review of Systems  Ten systems reviewed and are negative for acute change, except as noted in the HPI.    Physical Exam Updated Vital Signs BP 107/68 (BP Location: Right Arm)   Pulse 73   Temp (!) 97.5 F (36.4 C) (Oral)   Resp 18   SpO2 100%   Physical Exam Vitals and nursing note reviewed.  Constitutional:      General: He is not in acute distress.    Appearance: He is well-developed. He is not diaphoretic.     Comments: Nontoxic appearing.  HENT:     Head: Normocephalic and atraumatic.  Eyes:     General: No scleral icterus.    Conjunctiva/sclera: Conjunctivae normal.  Pulmonary:     Effort: Pulmonary effort is normal. No respiratory distress.     Comments: Respirations even and unlabored Musculoskeletal:        General: Normal range of motion.     Cervical back: Normal range of motion.  Skin:    General: Skin is warm and dry.     Coloration: Skin is not pale.     Findings: No erythema or rash.  Neurological:     Mental Status: He is alert and oriented to  person, place, and time.     Comments: GCS 15.  Answers questions appropriately and follows commands.  Moving all extremities spontaneously.  Psychiatric:        Behavior: Behavior normal.     ED Results / Procedures / Treatments   Labs (all labs ordered are listed, but only abnormal results are displayed) Labs Reviewed - No data to display  EKG None  Radiology No results found.  Procedures Procedures (including critical care time)  Medications Ordered in ED Medications - No data to display  ED Course  I have reviewed the triage vital signs and the nursing notes.  Pertinent labs & imaging results that were available during my care of the patient were reviewed by me and considered in my medical decision making (see chart for details).    MDM Rules/Calculators/A&P                       23 year old male presenting to the emergency department with complaints of hunger.  He was  given a pack of graham crackers and some water to drink.  His CBG with EMS was reassuring.  Suspected history of homelessness.  Will give resource guide with a list of shelters.  Patient discharged from the department in stable condition.   Final Clinical Impression(s) / ED Diagnoses Final diagnoses:  Hungry, initial encounter    Rx / DC Orders ED Discharge Orders    None       Antonietta Breach, PA-C 93/26/71 2458    Delora Fuel, MD 09/98/33 [phone]

## 2019-04-15 NOTE — ED Notes (Signed)
Discharge instructions discussed with pt. Pt verbalized understanding. Pt stable and ambulatory. No signature pad available. 

## 2019-04-15 NOTE — Discharge Instructions (Addendum)
If you are homeless, you were provided a list of shelters in the area.

## 2019-04-21 ENCOUNTER — Emergency Department (HOSPITAL_COMMUNITY)
Admission: EM | Admit: 2019-04-21 | Discharge: 2019-04-21 | Disposition: A | Discharge status: Home / Self Care | Payer: Medicaid Other | Attending: Emergency Medicine | Admitting: Emergency Medicine

## 2019-04-21 ENCOUNTER — Encounter (HOSPITAL_COMMUNITY): Payer: Self-pay

## 2019-04-21 DIAGNOSIS — F17210 Nicotine dependence, cigarettes, uncomplicated: Secondary | ICD-10-CM | POA: Insufficient documentation

## 2019-04-21 DIAGNOSIS — K219 Gastro-esophageal reflux disease without esophagitis: Secondary | ICD-10-CM | POA: Diagnosis not present

## 2019-04-21 DIAGNOSIS — R12 Heartburn: Secondary | ICD-10-CM | POA: Diagnosis present

## 2019-04-21 MED ORDER — SODIUM CHLORIDE 0.9% FLUSH: 3.0000 mL | Freq: Once | INTRAVENOUS | Status: DC

## 2019-04-21 MED ORDER — ALUM & MAG HYDROXIDE-SIMETH 200-200-20 MG/5ML PO SUSP
30.0000 mL | Freq: Once | ORAL | Status: AC
  Administered 2019-04-21: 30 mL via ORAL
  Filled 2019-04-21: qty 30

## 2019-04-21 MED ORDER — LIDOCAINE VISCOUS HCL 2 % MT SOLN
15.0000 mL | Freq: Once | OROMUCOSAL | Status: AC
  Administered 2019-04-21: 09:00:00 15 mL via ORAL
  Filled 2019-04-21: qty 15

## 2019-04-21 NOTE — Discharge Instructions (Addendum)
You have been seen today for heart burn. Please read and follow all provided instructions. Return to the emergency room for worsening condition or new concerning symptoms.    1. Medications:  No new prescriptions today. If you continue to have heart burn you can take over the counter medicines such as TUMS.  Continue usual home medications Take medications as prescribed. Please review all of the medicines and only take them if you do not have an allergy to them.   2. Treatment: rest, drink plenty of fluids.   3. Follow Up:  Please follow up with primary care provider by scheduling an appointment as soon as possible for a visit  If you do not have a primary care physician, contact [facility] at [phone] for referral   It is also a possibility that you have an allergic reaction to any of the medicines that you have been prescribed - Everybody reacts differently to medications and while MOST people have no trouble with most medicines, you may have a reaction such as nausea, vomiting, rash, swelling, shortness of breath. If this is the case, please stop taking the medicine immediately and contact your physician.  ?

## 2019-04-21 NOTE — ED Triage Notes (Signed)
Pt comes via [facility] reports having GERD after eating steak tonight

## 2019-04-21 NOTE — ED Provider Notes (Addendum)
[name] Provider Note   CSN: [id number] Arrival date & time: 04/21/19  0347     History Chief Complaint  Patient presents with  . Gastroesophageal Reflux    [name] is a 24 y.o. male with past medical history significant for autism, bipolar 1 disorder, seizures presents to emergency department today via EMS with chief complaint of heart burn. Onset was acute starting just prior to arrival. Patient states he ate two spicy steaks last night for dinner. When he laid down to go to bed he had a burning sensation in the middle of his chest. He states pain has been intermittent but kept him awake most of the night. He did not take anything for pain prior to arrival.  He denies any chest pain, dyspnea on exertion, SOB, chest tightness or pressure, radiation to left/right arm, jaw or back, nausea, vomiting, or diaphoresis, syncope, lower extremity edema. Denies family history of cardiac disease. History provided by patient with additional history obtained from chart review.     Past Medical History:  Diagnosis Date  . Autism   . Bipolar 1 disorder ([place])   . Seizures ([place])    none since 24 years old    Patient Active Problem List   Diagnosis Date Noted  . 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 TOOTH EXTRACTION         No family history on file.  Social History   Tobacco Use  . Smoking status: Current Some Day Smoker    Packs/day: 2.00    Types: Cigarettes  . Smokeless tobacco: Never Used  . Tobacco comment: 4 packs a day  Substance Use Topics  . Alcohol use: No  . Drug use: No    Comment: "BD"- stated he took one time     Home Medications Prior to Admission medications   Medication Sig Start Date End Date Taking? Authorizing Provider  naproxen (NAPROSYN) 500 MG tablet Take 1 tablet (500 mg total) by mouth 2 (two) times daily  with a meal. 04/01/19   Antonietta Breach, PA-C  penicillin v potassium (VEETID) 500 MG tablet Take 1 tablet (500 mg total) by mouth 4 (four) times daily. Patient not taking: Reported on 04/01/2019 02/04/19   Larene Pickett, PA-C    Allergies    Other  Review of Systems   Review of Systems  All other systems are reviewed and are negative for acute change except as noted in the HPI.   Physical Exam Updated Vital Signs BP 124/69   Pulse 77   Temp 97.6 F (36.4 C) (Oral)   Resp 18   SpO2 98%   Physical Exam Vitals and nursing note reviewed.  Constitutional:      General: He is not in acute distress.    Appearance: He is not ill-appearing.  HENT:     Head: Normocephalic and atraumatic.     Right Ear: Tympanic membrane and external ear normal.     Left Ear: Tympanic membrane and external ear normal.     Nose: Nose normal.     Mouth/Throat:     Mouth: Mucous membranes are moist.     Pharynx: Oropharynx is clear.  Eyes:     General: No scleral icterus.       Right eye: No discharge.        Left eye: No discharge.     Extraocular  Movements: Extraocular movements intact.     Conjunctiva/sclera: Conjunctivae normal.     Pupils: Pupils are equal, round, and reactive to light.  Neck:     Vascular: No JVD.  Cardiovascular:     Rate and Rhythm: Normal rate and regular rhythm.     Pulses: Normal pulses.          Radial pulses are 2+ on the right side and 2+ on the left side.     Heart sounds: Normal heart sounds.  Pulmonary:     Comments: Lungs clear to auscultation in all fields. Symmetric chest rise. No wheezing, rales, or rhonchi. Chest:     Chest wall: No tenderness.  Abdominal:     Comments: Abdomen is soft, non-distended, and non-tender in all quadrants. No rigidity, no guarding. No peritoneal signs.  Musculoskeletal:        General: Normal range of motion.     Cervical back: Normal range of motion.     Right lower leg: No edema.     Left lower leg: No edema.  Skin:     General: Skin is warm and dry.     Capillary Refill: Capillary refill takes less than 2 seconds.  Neurological:     Mental Status: He is oriented to person, place, and time.     GCS: GCS eye subscore is 4. GCS verbal subscore is 5. GCS motor subscore is 6.     Comments: Fluent speech, no facial droop.  Psychiatric:        Behavior: Behavior normal.       ED Results / Procedures / Treatments   Labs (all labs ordered are listed, but only abnormal results are displayed) Labs Reviewed - No data to display  EKG Date/Time:   Sunday April 19 2019 08:44:41 Ventricular Rate: 73 PR Interval: 154 QRS Duration: 94 QT interval: 364 QTC calculation: 401 R axis: 90 Text Interpretation: Normal sinus rhythm, rightward axis. No significant change from prior  Radiology No results found.  Procedures Procedures (including critical care time)  Medications Ordered in ED Medications  alum & mag hydroxide-simeth (MAALOX/MYLANTA) 200-200-20 MG/5ML suspension 30 mL (30 mLs Oral Given 04/21/19 0846)    And  lidocaine (XYLOCAINE) 2 % viscous mouth solution 15 mL (15 mLs Oral Given 04/21/19 0846)    ED Course  I have reviewed the triage vital signs and the nursing notes.  Pertinent labs & imaging results that were available during my care of the patient were reviewed by me and considered in my medical decision making (see chart for details).    MDM Rules/Calculators/A&P                      Patient seen ad examined. He is afebrile, normotensive, without hypoxia or tachycardia. He is well appearing and in no acute stress. Does not appear toxic. He has no chest or abdominal pain on my exam. He has symptoms of GERD. He unfortunately had prolonged wait in waiting room x 4 hours. By the time of my exam his pain has signficantly improved. Low suspicion of ACS, PE, dissection, gastritis, appendicitis, bilary colic, cholecystitis. No emesis prior to arrival.   EKG without ischemic changes.  Given GI  cocktail and on reassessment patient is asymptomatic. He is tolerating PO intake. Given reassuring vitals and exam today feel that he can be discharged home with symptomatic treatment. Doubt need for further emergent work up at this time. I explained the diagnosis and have given explicit  precautions to return to the ER including for any other new or worsening symptoms. The patient understands and accepts the medical plan as it's been dictated and I have answered their questions. Discharge instructions concerning home care have been given. The patient is STABLE and is discharged to home in good condition. Findings and plan of care discussed with supervising physician Dr. Juleen China.    Portions of this note were generated with Scientist, clinical (histocompatibility and immunogenetics). Dictation errors may occur despite best attempts at proofreading.   Final Clinical Impression(s) / ED Diagnoses Final diagnoses:  Gastroesophageal reflux disease without esophagitis    Rx / DC Orders ED Discharge Orders    None       Sherene Sires, PA-C 04/21/19 0908    Greco Gastelum, Caroleen Hamman, PA-C 04/21/19 0910    Raeford Razor, MD 04/21/19 1228

## 2019-05-02 ENCOUNTER — Encounter (HOSPITAL_COMMUNITY): Payer: Self-pay | Admitting: Emergency Medicine

## 2019-05-02 ENCOUNTER — Other Ambulatory Visit: Payer: Self-pay

## 2019-05-02 DIAGNOSIS — M25571 Pain in right ankle and joints of right foot: Secondary | ICD-10-CM | POA: Insufficient documentation

## 2019-05-02 DIAGNOSIS — Z79899 Other long term (current) drug therapy: Secondary | ICD-10-CM | POA: Insufficient documentation

## 2019-05-02 DIAGNOSIS — F17210 Nicotine dependence, cigarettes, uncomplicated: Secondary | ICD-10-CM | POA: Diagnosis not present

## 2019-05-02 NOTE — ED Triage Notes (Signed)
Pt came to ED for right ankle  Denies event or injury

## 2019-05-02 NOTE — ED Triage Notes (Signed)
[place]

## 2019-05-03 ENCOUNTER — Emergency Department (HOSPITAL_COMMUNITY)
Admission: EM | Admit: 2019-05-03 | Discharge: 2019-05-03 | Disposition: A | Discharge status: Home / Self Care | Payer: Medicaid Other | Attending: Emergency Medicine | Admitting: Emergency Medicine

## 2019-05-03 DIAGNOSIS — M25571 Pain in right ankle and joints of right foot: Secondary | ICD-10-CM

## 2019-05-03 MED ORDER — IBUPROFEN 200 MG PO TABS
600.0000 mg | ORAL_TABLET | Freq: Once | ORAL | Status: AC
  Administered 2019-05-03: 600 mg via ORAL
  Filled 2019-05-03: qty 3

## 2019-05-03 NOTE — ED Notes (Signed)
Pt sleeping in lobby 

## 2019-05-03 NOTE — ED Notes (Signed)
RN attempted to go over d/c paperwork. Patient took his ibuprofen, threw the cup in the floor and requested a bus pass.  RN obtained bus pass for patient.  Patient did not sign for d/c papers and left the paperwork at bedside.  Patient ambulated to bathroom and then out of ER without assistance.

## 2019-05-03 NOTE — ED Provider Notes (Signed)
[facility] DEPT Provider Note   CSN: [id number] Arrival date & time: 05/02/19  2324     History Chief Complaint  Patient presents with  . Ankle Pain    [name] is a 24 y.o. male.  24 year old male who presents with atraumatic right ankle pain.  Patient states the pain is the base of his ankle is worse with movement.  Denies any fever or chills.  Symptoms better with remaining still.  No treatment use prior to arrival        Past Medical History:  Diagnosis Date  . Autism   . Bipolar 1 disorder ([place])   . Seizures ([place])    none since 24 years old    Patient Active Problem List   Diagnosis Date Noted  . 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 TOOTH EXTRACTION         History reviewed. No pertinent family history.  Social History   Tobacco Use  . Smoking status: Current Some Day Smoker    Packs/day: 2.00    Types: Cigarettes  . Smokeless tobacco: Never Used  . Tobacco comment: 4 packs a day  Substance Use Topics  . Alcohol use: No  . Drug use: No    Comment: "BD"- stated he took one time     Home Medications Prior to Admission medications   Medication Sig Start Date End Date Taking? Authorizing Provider  naproxen (NAPROSYN) 500 MG tablet Take 1 tablet (500 mg total) by mouth 2 (two) times daily with a meal. 04/01/19   Antonietta Breach, PA-C  penicillin v potassium (VEETID) 500 MG tablet Take 1 tablet (500 mg total) by mouth 4 (four) times daily. Patient not taking: Reported on 04/01/2019 02/04/19   Larene Pickett, PA-C    Allergies    Other  Review of Systems   Review of Systems  All other systems reviewed and are negative.   Physical Exam Updated Vital Signs BP 110/75 (BP Location: Left Arm)   Pulse 64   Temp 98.4 F (36.9 C) (Oral)   Resp 16   SpO2 99%   Physical Exam Vitals and nursing note reviewed.    Constitutional:      General: He is not in acute distress.    Appearance: Normal appearance. He is well-developed. He is not toxic-appearing.  HENT:     Head: Normocephalic and atraumatic.  Eyes:     General: Lids are normal.     Conjunctiva/sclera: Conjunctivae normal.     Pupils: Pupils are equal, round, and reactive to light.  Neck:     Thyroid: No thyroid mass.     Trachea: No tracheal deviation.  Cardiovascular:     Rate and Rhythm: Normal rate and regular rhythm.     Heart sounds: Normal heart sounds. No murmur. No gallop.   Pulmonary:     Effort: Pulmonary effort is normal. No respiratory distress.     Breath sounds: Normal breath sounds. No stridor. No decreased breath sounds, wheezing, rhonchi or rales.  Abdominal:     General: Bowel sounds are normal. There is no distension.     Palpations: Abdomen is soft.     Tenderness: There is no abdominal tenderness. There is no rebound.  Musculoskeletal:        General: No tenderness. Normal range of motion.     Cervical back: Normal range of motion and neck  supple.       Feet:  Skin:    General: Skin is warm and dry.     Findings: No abrasion or rash.  Neurological:     Mental Status: He is alert and oriented to person, place, and time.     GCS: GCS eye subscore is 4. GCS verbal subscore is 5. GCS motor subscore is 6.     Cranial Nerves: No cranial nerve deficit.     Sensory: No sensory deficit.  Psychiatric:        Speech: Speech normal.        Behavior: Behavior normal.     ED Results / Procedures / Treatments   Labs (all labs ordered are listed, but only abnormal results are displayed) Labs Reviewed - No data to display  EKG None  Radiology No results found.  Procedures Procedures (including critical care time)  Medications Ordered in ED Medications  ibuprofen (ADVIL) tablet 600 mg (has no administration in time range)    ED Course  I have reviewed the triage vital signs and the nursing  notes.  Pertinent labs & imaging results that were available during my care of the patient were reviewed by me and considered in my medical decision making (see chart for details).    MDM Rules/Calculators/A&P                     Patient atraumatic right ankle pain.  No obvious indication for infection or bruising.  Will treat with ibuprofen and return precautions given Final Clinical Impression(s) / ED Diagnoses Final diagnoses:  None    Rx / DC Orders ED Discharge Orders    None       Lorre Nick, MD 05/03/19 [phone]

## 2019-05-22 ENCOUNTER — Encounter (HOSPITAL_COMMUNITY): Payer: Self-pay

## 2019-05-22 ENCOUNTER — Emergency Department (HOSPITAL_COMMUNITY): Payer: Medicaid Other

## 2019-05-22 ENCOUNTER — Other Ambulatory Visit: Payer: Self-pay

## 2019-05-22 ENCOUNTER — Emergency Department (HOSPITAL_COMMUNITY)
Admission: EM | Admit: 2019-05-22 | Discharge: 2019-05-22 | Disposition: A | Discharge status: Home / Self Care | Payer: Medicaid Other | Source: Home / Self Care | Attending: Emergency Medicine | Admitting: Emergency Medicine

## 2019-05-22 ENCOUNTER — Emergency Department (HOSPITAL_COMMUNITY)
Admission: EM | Admit: 2019-05-22 | Discharge: 2019-05-22 | Disposition: A | Discharge status: Home / Self Care | Payer: Medicaid Other | Attending: Emergency Medicine | Admitting: Emergency Medicine

## 2019-05-22 ENCOUNTER — Encounter (HOSPITAL_COMMUNITY): Payer: Self-pay | Admitting: Emergency Medicine

## 2019-05-22 DIAGNOSIS — Z791 Long term (current) use of non-steroidal anti-inflammatories (NSAID): Secondary | ICD-10-CM | POA: Insufficient documentation

## 2019-05-22 DIAGNOSIS — R519 Headache, unspecified: Secondary | ICD-10-CM | POA: Insufficient documentation

## 2019-05-22 DIAGNOSIS — F17210 Nicotine dependence, cigarettes, uncomplicated: Secondary | ICD-10-CM | POA: Insufficient documentation

## 2019-05-22 DIAGNOSIS — Y939 Activity, unspecified: Secondary | ICD-10-CM | POA: Insufficient documentation

## 2019-05-22 DIAGNOSIS — S0990XA Unspecified injury of head, initial encounter: Secondary | ICD-10-CM | POA: Insufficient documentation

## 2019-05-22 DIAGNOSIS — F840 Autistic disorder: Secondary | ICD-10-CM | POA: Insufficient documentation

## 2019-05-22 DIAGNOSIS — Y999 Unspecified external cause status: Secondary | ICD-10-CM | POA: Insufficient documentation

## 2019-05-22 DIAGNOSIS — Y929 Unspecified place or not applicable: Secondary | ICD-10-CM | POA: Insufficient documentation

## 2019-05-22 LAB — COMPREHENSIVE METABOLIC PANEL
ALT: 18 U/L (ref 0–44)
AST: 17 U/L (ref 15–41)
Albumin: 4.1 g/dL (ref 3.5–5.0)
Alkaline Phosphatase: 76 U/L (ref 38–126)
Anion gap: 10 (ref 5–15)
BUN: 10 mg/dL (ref 6–20)
CO2: 24 mmol/L (ref 22–32)
Calcium: 9.4 mg/dL (ref 8.9–10.3)
Chloride: 107 mmol/L (ref 98–111)
Creatinine, Ser: 0.87 mg/dL (ref 0.61–1.24)
GFR calc Af Amer: >60 mL/min (ref >60)
GFR calc non Af Amer: >60 mL/min (ref >60)
Glucose, Bld: 96 mg/dL (ref 70–99)
Potassium: 3.9 mmol/L (ref 3.5–5.1)
Sodium: 141 mmol/L (ref 135–145)
Total Bilirubin: 0.5 mg/dL (ref 0.3–1.2)
Total Protein: 7 g/dL (ref 6.5–8.1)

## 2019-05-22 LAB — CBC WITH DIFFERENTIAL/PLATELET
Abs Immature Granulocytes: 0.01 10*3/uL (ref 0.00–0.07)
Basophils Absolute: 0 10*3/uL (ref 0.0–0.1)
Basophils Relative: 1 %
Eosinophils Absolute: 0 10*3/uL (ref 0.0–0.5)
Eosinophils Relative: 0 %
HCT: 45.5 % (ref 39.0–52.0)
Hemoglobin: 15.8 g/dL (ref 13.0–17.0)
Immature Granulocytes: 0 %
Lymphocytes Relative: 29 %
Lymphs Abs: 1.3 10*3/uL (ref 0.7–4.0)
MCH: 29.7 pg (ref 26.0–34.0)
MCHC: 34.7 g/dL (ref 30.0–36.0)
MCV: 85.5 fL (ref 80.0–100.0)
Monocytes Absolute: 0.5 10*3/uL (ref 0.1–1.0)
Monocytes Relative: 11 %
Neutro Abs: 2.6 10*3/uL (ref 1.7–7.7)
Neutrophils Relative %: 59 %
Platelets: 267 10*3/uL (ref 150–400)
RBC: 5.32 MIL/uL (ref 4.22–5.81)
RDW: 11.2 % — ABNORMAL LOW (ref 11.5–15.5)
WBC: 4.3 10*3/uL (ref 4.0–10.5)
nRBC: 0 % (ref 0.0–0.2)

## 2019-05-22 MED ORDER — DEXAMETHASONE 4 MG PO TABS
10.0000 mg | ORAL_TABLET | Freq: Once | ORAL | Status: AC
  Administered 2019-05-22: 10 mg via ORAL
  Filled 2019-05-22: qty 3

## 2019-05-22 MED ORDER — BUTALBITAL-APAP-CAFFEINE 50-325-40 MG PO TABS
2.0000 | ORAL_TABLET | Freq: Once | ORAL | Status: AC
  Administered 2019-05-22: 08:00:00 2 via ORAL
  Filled 2019-05-22: qty 2

## 2019-05-22 MED ORDER — BUTALBITAL-APAP-CAFFEINE 50-325-40 MG PO TABS: 1.0000 | ORAL_TABLET | Freq: Four times a day (QID) | ORAL | 0 refills | Status: DC | PRN

## 2019-05-22 MED ORDER — ACETAMINOPHEN 325 MG PO TABS: 650.0000 mg | ORAL_TABLET | Freq: Once | ORAL | Status: DC
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
## 2019-05-22 NOTE — Discharge Instructions (Signed)
Return for worsening headache, fever, inability to eat or drink, one sided weakness or numbness.

## 2019-05-22 NOTE — Discharge Instructions (Addendum)
All imaging done for you today was normal.  You may take some tylenol to help with your pain.  Follow up with  your Primary care as needed.
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
## 2019-05-22 NOTE — ED Triage Notes (Signed)
Patient arrived with EMS from home reports persistent headache onset yesterday , denies head injury , no emesis or photophobia .
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
## 2019-06-20 ENCOUNTER — Encounter (HOSPITAL_COMMUNITY): Payer: Self-pay | Admitting: Emergency Medicine

## 2019-06-20 ENCOUNTER — Emergency Department (HOSPITAL_COMMUNITY)
Admission: EM | Admit: 2019-06-20 | Discharge: 2019-06-21 | Disposition: A | Discharge status: Home / Self Care | Payer: Medicaid Other | Attending: Emergency Medicine | Admitting: Emergency Medicine

## 2019-06-20 ENCOUNTER — Other Ambulatory Visit: Payer: Self-pay

## 2019-06-20 DIAGNOSIS — F319 Bipolar disorder, unspecified: Secondary | ICD-10-CM | POA: Insufficient documentation

## 2019-06-20 DIAGNOSIS — G8911 Acute pain due to trauma: Secondary | ICD-10-CM | POA: Diagnosis not present

## 2019-06-20 DIAGNOSIS — F17210 Nicotine dependence, cigarettes, uncomplicated: Secondary | ICD-10-CM | POA: Insufficient documentation

## 2019-06-20 DIAGNOSIS — M545 Low back pain: Secondary | ICD-10-CM | POA: Diagnosis present

## 2019-06-20 DIAGNOSIS — M533 Sacrococcygeal disorders, not elsewhere classified: Secondary | ICD-10-CM | POA: Diagnosis not present

## 2019-06-20 MED ORDER — IBUPROFEN 400 MG PO TABS
400.0000 mg | ORAL_TABLET | Freq: Once | ORAL | Status: AC | PRN
  Administered 2019-06-20: 400 mg via ORAL
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
## 2019-06-21 ENCOUNTER — Emergency Department (HOSPITAL_COMMUNITY): Payer: Medicaid Other

## 2019-06-21 MED ORDER — ACETAMINOPHEN 500 MG PO TABS
500.0000 mg | ORAL_TABLET | Freq: Once | ORAL | Status: AC
  Administered 2019-06-21: 500 mg via ORAL
  Filled 2019-06-21: qty 1

## 2019-06-21 NOTE — Discharge Instructions (Addendum)
Continue to ice the affected region and rotate taking ibuprofen and tylenol as needed for your pain. This injury will take some time to resolve but with proper stretching and maintaining your activity levels you shoulder expect your symptoms to begin to improve in the next few days. It was nice meeting you.
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
## 2019-06-21 NOTE — ED Notes (Signed)
Patient verbalizes understanding of discharge instructions. Opportunity for questioning and answers were provided. Armband removed by staff, pt discharged from ED ambulatory.   

## 2019-06-28 ENCOUNTER — Other Ambulatory Visit: Payer: Self-pay

## 2019-06-28 DIAGNOSIS — Z20822 Contact with and (suspected) exposure to covid-19: Secondary | ICD-10-CM

## 2019-06-29 LAB — NOVEL CORONAVIRUS, NAA: SARS-CoV-2, NAA: NOT DETECTED

## 2019-07-19 ENCOUNTER — Encounter (HOSPITAL_COMMUNITY): Payer: Self-pay | Admitting: Emergency Medicine

## 2019-07-19 ENCOUNTER — Other Ambulatory Visit: Payer: Self-pay

## 2019-07-19 ENCOUNTER — Emergency Department (HOSPITAL_COMMUNITY)
Admission: EM | Admit: 2019-07-19 | Discharge: 2019-07-19 | Disposition: A | Discharge status: Home / Self Care | Payer: Medicaid Other | Attending: Emergency Medicine | Admitting: Emergency Medicine

## 2019-07-19 ENCOUNTER — Emergency Department (HOSPITAL_COMMUNITY): Payer: Medicaid Other

## 2019-07-19 DIAGNOSIS — R079 Chest pain, unspecified: Secondary | ICD-10-CM | POA: Diagnosis present

## 2019-07-19 DIAGNOSIS — F17210 Nicotine dependence, cigarettes, uncomplicated: Secondary | ICD-10-CM | POA: Diagnosis not present

## 2019-07-19 DIAGNOSIS — R0789 Other chest pain: Secondary | ICD-10-CM | POA: Diagnosis not present

## 2019-07-19 DIAGNOSIS — F840 Autistic disorder: Secondary | ICD-10-CM | POA: Diagnosis not present

## 2019-07-19 DIAGNOSIS — Z79899 Other long term (current) drug therapy: Secondary | ICD-10-CM | POA: Insufficient documentation

## 2019-07-19 LAB — HEPATIC FUNCTION PANEL
ALT: 16 U/L (ref 0–44)
AST: 20 U/L (ref 15–41)
Albumin: 3.9 g/dL (ref 3.5–5.0)
Alkaline Phosphatase: 66 U/L (ref 38–126)
Bilirubin, Direct: <0.1 mg/dL (ref 0.0–0.2)
Total Bilirubin: 0.5 mg/dL (ref 0.3–1.2)
Total Protein: 6.3 g/dL — ABNORMAL LOW (ref 6.5–8.1)

## 2019-07-19 LAB — CBC
HCT: 43.4 % (ref 39.0–52.0)
Hemoglobin: 14.7 g/dL (ref 13.0–17.0)
MCH: 31 pg (ref 26.0–34.0)
MCHC: 33.9 g/dL (ref 30.0–36.0)
MCV: 91.6 fL (ref 80.0–100.0)
Platelets: 188 10*3/uL (ref 150–400)
RBC: 4.74 MIL/uL (ref 4.22–5.81)
RDW: 12.3 % (ref 11.5–15.5)
WBC: 6.4 10*3/uL (ref 4.0–10.5)
nRBC: 0 % (ref 0.0–0.2)

## 2019-07-19 LAB — BASIC METABOLIC PANEL
Anion gap: 9 (ref 5–15)
BUN: 11 mg/dL (ref 6–20)
CO2: 29 mmol/L (ref 22–32)
Calcium: 9.4 mg/dL (ref 8.9–10.3)
Chloride: 105 mmol/L (ref 98–111)
Creatinine, Ser: 0.81 mg/dL (ref 0.61–1.24)
GFR calc Af Amer: >60 mL/min (ref >60)
GFR calc non Af Amer: >60 mL/min (ref >60)
Glucose, Bld: 102 mg/dL — ABNORMAL HIGH (ref 70–99)
Potassium: 3.8 mmol/L (ref 3.5–5.1)
Sodium: 143 mmol/L (ref 135–145)

## 2019-07-19 LAB — TROPONIN I (HIGH SENSITIVITY)
Troponin I (High Sensitivity): 4 ng/L (ref <18)
Troponin I (High Sensitivity): 4 ng/L (ref <18)

## 2019-07-19 LAB — LIPASE, BLOOD: Lipase: 22 U/L (ref 11–51)

## 2019-07-19 MED ORDER — SODIUM CHLORIDE 0.9% FLUSH: 3.0000 mL | Freq: Once | INTRAVENOUS | Status: DC

## 2019-07-19 MED ORDER — IBUPROFEN 400 MG PO TABS
600.0000 mg | ORAL_TABLET | Freq: Once | ORAL | Status: AC
  Administered 2019-07-19: 600 mg via ORAL
  Filled 2019-07-19: qty 1

## 2019-07-19 NOTE — ED Provider Notes (Signed)
[name] EMERGENCY DEPARTMENT Provider Note   CSN: [id number] Arrival date & time: 07/19/19  1831     History Chief Complaint  Patient presents with  . Chest Pain    [name] is a 24 y.o. male.  f  The history is provided by the patient and medical records. No language interpreter was used.  Chest Pain  [name] is a 24 y.o. male who presents to the Emergency Department complaining of chest pain.  He presents to the ED complaining of one week of sharp, right sided chest pain.  Pain is waxing and waning and radiates throughout his right flank.  No significant change in movement.  He has occasional sob.  Denies fever, cough, diaphoresis, abdominal pain, N/V/D, leg swelling/pain.  He has a hx/o psychiatric illnesses, no significant past medical hx.  He smokes tobacco, no alcohol, no street drugs.  He has experienced similar sxs in the past with no clear cause found.      Past Medical History:  Diagnosis Date  . Autism   . Bipolar 1 disorder ([facility])   . Seizures ([facility])    none since 24 years old    Patient Active Problem List   Diagnosis Date Noted  . 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 TOOTH EXTRACTION         No family history on file.  Social History   Tobacco Use  . Smoking status: Current Some Day Smoker    Packs/day: 2.00    Types: Cigarettes  . Smokeless tobacco: Never Used  . Tobacco comment: 4 packs a day  Substance Use Topics  . Alcohol use: No  . Drug use: No    Comment: "BD"- stated he took one time     Home Medications Prior to Admission medications   Medication Sig Start Date End Date Taking? Authorizing Provider  benztropine (COGENTIN) 1 MG tablet Take 1 mg by mouth at bedtime. 07/09/19  Yes [provider]  guanFACINE (INTUNIV) 1 MG TB24 ER tablet Take 1 mg by mouth at bedtime. 07/09/19  Yes [provider]  guanFACINE (TENEX) 1 MG tablet Take 1 mg by mouth every morning. 07/09/19  Yes [provider]  naproxen (NAPROSYN) 500 MG tablet Take 1 tablet (500 mg total) by mouth 2 (two) times daily with a meal. Patient taking differently: Take 500 mg by mouth 2 (two) times daily as needed (pain).  04/01/19  Yes Antony Madura, PA-C  OLANZapine (ZYPREXA) 15 MG tablet Take 15 mg by mouth at bedtime. 07/09/19  Yes [provider]  traZODone (DESYREL) 50 MG tablet Take 50-100 mg by mouth at bedtime as needed for sleep.  07/09/19  Yes [provider]    Allergies    Daytrana [methylphenidate]  Review of Systems   Review of Systems  Cardiovascular: Positive for chest pain.  All other systems reviewed and are negative.   Physical Exam Updated Vital Signs BP 119/67   Pulse 79   Temp 98.1 F (36.7 C) (Oral)   Resp 17   Ht 5\' 9"  (1.753 m)   Wt 68 kg   SpO2 100%   BMI 22.14 kg/m   Physical Exam Vitals and nursing note reviewed.  Constitutional:      Appearance: He is well-developed.  HENT:     Head: Normocephalic and atraumatic.  Cardiovascular:     Rate and Rhythm:  Normal rate and regular rhythm.     Heart sounds: No murmur.  Pulmonary:     Effort: Pulmonary effort is normal. No respiratory distress.     Breath sounds: Normal breath sounds.  Abdominal:     Palpations: Abdomen is soft.     Tenderness: There is no guarding or rebound.     Comments: Moderate RUQ tenderness  Musculoskeletal:        General: No swelling or tenderness.  Skin:    General: Skin is warm and dry.  Neurological:     Mental Status: He is alert and oriented to person, place, and time.  Psychiatric:        Behavior: Behavior normal.     ED Results / Procedures / Treatments   Labs (all labs ordered are listed, but only abnormal results are displayed) Labs Reviewed  BASIC METABOLIC PANEL - Abnormal; Notable for the following components:      Result Value   Glucose, Bld  102 (*)    All other components within normal limits  HEPATIC FUNCTION PANEL - Abnormal; Notable for the following components:   Total Protein 6.3 (*)    All other components within normal limits  CBC  LIPASE, BLOOD  TROPONIN I (HIGH SENSITIVITY)  TROPONIN I (HIGH SENSITIVITY)    EKG EKG Interpretation  Date/Time:  Friday July 19 2019 18:32:36 EDT Ventricular Rate:  86 PR Interval:  138 QRS Duration: 86 QT Interval:  334 QTC Calculation: 399 R Axis:   79 Text Interpretation: Normal sinus rhythm Septal infarct , age undetermined Abnormal ECG Confirmed by Quintella Reichert [phone]) on 07/19/2019 6:44:15 PM   Radiology DG Chest 2 View  Result Date: 07/19/2019 CLINICAL DATA:  Right-sided chest pain radiating to right clavicle, short of breath, tobacco abuse EXAM: CHEST - 2 VIEW COMPARISON:  05/22/2019 FINDINGS: The heart size and mediastinal contours are within normal limits. Both lungs are clear. The visualized skeletal structures are unremarkable. IMPRESSION: No active cardiopulmonary disease. Electronically Signed   By: Randa Ngo M.D.   On: 07/19/2019 19:08    Procedures Procedures (including critical care time)  Medications Ordered in ED Medications  sodium chloride flush (NS) 0.9 % injection 3 mL (has no administration in time range)  ibuprofen (ADVIL) tablet 600 mg (600 mg Oral Given 07/19/19 2121)    ED Course  I have reviewed the triage vital signs and the nursing notes.  Pertinent labs & imaging results that were available during my care of the patient were reviewed by me and considered in my medical decision making (see chart for details).    MDM Rules/Calculators/A&P                     Patient here for evaluation of intermittent right sided chest pain. He does have right upper quadrant tenderness on initial assessment. On repeat assessment he states his pain is completely resolved and he has no repeat tenderness on repeat abdominal examination. Presentation is not  consistent with ACS, PE, dissection, cholecystitis. Discussed with patient home care for chest pain, outpatient follow-up and return precautions.  Final Clinical Impression(s) / ED Diagnoses Final diagnoses:  Atypical chest pain    Rx / DC Orders ED Discharge Orders    None       Quintella Reichert, MD 07/19/19 2314

## 2019-07-19 NOTE — ED Notes (Signed)
Pt demanding to leave

## 2019-07-19 NOTE — ED Triage Notes (Signed)
To ED via [facility]- c/o cheset pain, right sided-- received ASA x4 enroute- has 18g IV in left AC-- pt is homeless-- was inside last night

## 2019-07-19 NOTE — ED Notes (Signed)
Some chest pain for one week none now

## 2019-07-19 NOTE — ED Notes (Signed)
The pt has removed all his wires   He wants to leave now  Iv removed at his command he does not want his discharge papers

## 2019-07-21 ENCOUNTER — Other Ambulatory Visit: Payer: Self-pay

## 2019-07-21 ENCOUNTER — Emergency Department (HOSPITAL_COMMUNITY)
Admission: EM | Admit: 2019-07-21 | Discharge: 2019-07-22 | Disposition: A | Discharge status: Home / Self Care | Payer: Medicaid Other | Attending: Emergency Medicine | Admitting: Emergency Medicine

## 2019-07-21 ENCOUNTER — Encounter (HOSPITAL_COMMUNITY): Payer: Self-pay

## 2019-07-21 DIAGNOSIS — Z79899 Other long term (current) drug therapy: Secondary | ICD-10-CM | POA: Diagnosis not present

## 2019-07-21 DIAGNOSIS — R0789 Other chest pain: Secondary | ICD-10-CM | POA: Diagnosis present

## 2019-07-21 DIAGNOSIS — F17210 Nicotine dependence, cigarettes, uncomplicated: Secondary | ICD-10-CM | POA: Insufficient documentation

## 2019-07-21 MED ORDER — IBUPROFEN 800 MG PO TABS
800.0000 mg | ORAL_TABLET | Freq: Once | ORAL | Status: AC
  Administered 2019-07-21: 800 mg via ORAL
  Filled 2019-07-21: qty 1

## 2019-07-21 NOTE — Discharge Instructions (Addendum)
You may alternate Tylenol 1000 mg every 6 hours as needed for pain and ibuprofen 800 mg every 8 hours as needed for pain.  These medications are found over-the-counter.  Steps to find a Primary Care Provider (PCP):  Call [phone] or [phone] to access "[facility] Find a Doctor Service."  2.  You may also go on the [facility] website at [email or web address]  3.  [facility] also frequently accepts new patients.  [facility]  [address] [place] [address] [phone]  4.  There are also multiple [facility], [facility] and [facility] practices throughout the [place] that are frequently accepting new patients. You may find a clinic that is close to your home and contact them.  [facility] [email or web address] [phone]  [email or web address]  [facility] [email or web address] [phone]  [facility] [email or web address] [phone]  5.  Local Health Departments also can provide primary care services.  [facility]  [address] [place] [address] [phone]  [facility] Department [address] [place] [address] [phone]  [facility] [address] [place] [address]  [place] [address] [phone]

## 2019-07-21 NOTE — ED Triage Notes (Signed)
Per EMS, patient was seen at [facility] yesterday for the same issue. [facility] did labs yesterday. Patient c/o shoulder pain. Patient has hx of bipolar disorder & schizophrenia. Patient states he has not been taking his medications for these disorders in over a month. Patient endorses more frequent occurences of panic attacks.

## 2019-07-21 NOTE — ED Provider Notes (Signed)
TIME SEEN: 11:22 PM  CHIEF COMPLAINT: Right-sided chest pain  HPI: Patient is a 24 year old male with history of bipolar disorder who presents to the emergency department 3 days of intermittent sharp right-sided chest pain worse with palpation.  No other aggravating or alleviating factors.  States symptoms started tonight just prior to arrival.  States he will feel short of breath with these episodes.  States he thinks it is a panic attack because that is what his friend told him it was.  He denies having any recent anxiety but states he has been under increased stress.  No SI, HI or hallucinations.  Denies drug or alcohol use.  No history of PE, DVT, exogenous estrogen use, recent fractures, surgery, trauma, hospitalization or prolonged travel. No lower extremity swelling or pain. No calf tenderness.  Denies fever or cough.  On review of records, it appears that patient was in the [facility] emergency department on 07/19/2019 for the same.  At that time he had normal work-up including EKG, labs with 2 high-sensitivity troponins and a chest x-ray.  ROS: See HPI Constitutional: no fever  Eyes: no drainage  ENT: no runny nose   Cardiovascular:   chest pain  Resp:  SOB  GI: no vomiting GU: no dysuria Integumentary: no rash  Allergy: no hives  Musculoskeletal: no leg swelling  Neurological: no slurred speech ROS otherwise negative  PAST MEDICAL HISTORY/PAST SURGICAL HISTORY:  Past Medical History:  Diagnosis Date  . Autism   . Bipolar 1 disorder ([place])   . Seizures ([place])    none since 24 years old    MEDICATIONS:  Prior to Admission medications   Medication Sig Start Date End Date Taking? Authorizing Provider  benztropine (COGENTIN) 1 MG tablet Take 1 mg by mouth at bedtime. 07/09/19   [provider]  guanFACINE (INTUNIV) 1 MG TB24 ER tablet Take 1 mg by mouth at bedtime. 07/09/19   [provider]  guanFACINE (TENEX) 1 MG tablet Take 1 mg by mouth every morning. 07/09/19    [provider]  naproxen (NAPROSYN) 500 MG tablet Take 1 tablet (500 mg total) by mouth 2 (two) times daily with a meal. Patient taking differently: Take 500 mg by mouth 2 (two) times daily as needed (pain).  04/01/19   Antonietta Breach, PA-C  OLANZapine (ZYPREXA) 15 MG tablet Take 15 mg by mouth at bedtime. 07/09/19   [provider]  traZODone (DESYREL) 50 MG tablet Take 50-100 mg by mouth at bedtime as needed for sleep.  07/09/19   [provider]    ALLERGIES:  Allergies  Allergen Reactions  . Daytrana [Methylphenidate] Rash    SOCIAL HISTORY:  Social History   Tobacco Use  . Smoking status: Current Some Day Smoker    Packs/day: 2.00    Types: Cigarettes  . Smokeless tobacco: Never Used  . Tobacco comment: 4 packs a day  Substance Use Topics  . Alcohol use: No    FAMILY HISTORY: No family history on file.  EXAM: BP 127/71 (BP Location: Right Arm)   Pulse 80   Temp (!) 97.4 F (36.3 C) (Oral)   Resp 12   Ht 5\' 9"  (1.753 m)   Wt 65.8 kg   SpO2 97%   BMI 21.41 kg/m  CONSTITUTIONAL: Alert and oriented and responds appropriately to questions. Well-appearing; well-nourished HEAD: Normocephalic EYES: Conjunctivae clear, pupils appear equal, EOM appear intact ENT: normal nose; moist mucous membranes NECK: Supple, normal ROM CARD: RRR; S1 and S2  appreciated; no murmurs, no clicks, no rubs, no gallops CHEST:  Chest wall is tender to palpation over the right upper chest wall and clavicle without deformity.  No crepitus, ecchymosis, erythema, warmth, rash or other lesions present.  No soft tissue swelling. RESP: Normal chest excursion without splinting or tachypnea; breath sounds clear and equal bilaterally; no wheezes, no rhonchi, no rales, no hypoxia or respiratory distress, speaking full sentences ABD/GI: Normal bowel sounds; non-distended; soft, non-tender, no rebound, no guarding, no peritoneal signs, no hepatosplenomegaly BACK:  The back appears  normal EXT: Normal ROM in all joints; no deformity noted, no edema; no cyanosis, no calf tenderness or calf swelling SKIN: Normal color for age and race; warm; no rash on exposed skin NEURO: Moves all extremities equally PSYCH: The patient's mood and manner are appropriate.   MEDICAL DECISION MAKING: Patient here with what appears to be chest wall pain.  He is PERC negative.  Doubt ACS.  Had cardiac work-up yesterday with 2 normal high-sensitivity troponins.  Chest x-ray was clear.  I do not feel this needs to be repeated.  No infectious symptoms.  Doubt dissection.  No sign of volume overload.  I feel he is safe to be discharged and alternate Tylenol and Motrin for pain.  Given ibuprofen here in the emergency department.  EKG here has been reviewed and interpreted by myself and shows no ischemic abnormality, arrhythmia or interval change and no change compared to previous EKG.   EKG Interpretation  Date/Time:  Sunday July 21 2019 23:27:59 EDT Ventricular Rate:  81 PR Interval:    QRS Duration: 89 QT Interval:  334 QTC Calculation: 388 R Axis:   84 Text Interpretation: Sinus or ectopic atrial rhythm ST elev, probable normal early repol pattern No significant change since last tracing Confirmed by Shanele Nissan, Baxter Hire [phone]) on 07/21/2019 11:45:56 PM      At this time, I do not feel there is any life-threatening condition present. I have reviewed, interpreted and discussed all results (EKG, imaging, lab, urine as appropriate) and exam findings with patient/family. I have reviewed nursing notes and appropriate previous records.  I feel the patient is safe to be discharged home without further emergent workup and can continue workup as an outpatient as needed. Discussed usual and customary return precautions. Patient/family verbalize understanding and are comfortable with this plan.  Outpatient follow-up has been provided as needed. All questions have been answered.   Jvion Turgeon was evaluated in  Emergency Department on 07/21/2019 for the symptoms described in the history of present illness. He was evaluated in the context of the global COVID-19 pandemic, which necessitated consideration that the patient might be at risk for infection with the SARS-CoV-2 virus that causes COVID-19. Institutional protocols and algorithms that pertain to the evaluation of patients at risk for COVID-19 are in a state of rapid change based on information released by regulatory bodies including the CDC and federal and state organizations. These policies and algorithms were followed during the patient's care in the ED.      Devontay Celaya, Layla Maw, DO 07/21/19 2349

## 2019-07-24 ENCOUNTER — Emergency Department (HOSPITAL_COMMUNITY): Payer: Medicaid Other

## 2019-07-24 ENCOUNTER — Emergency Department (HOSPITAL_COMMUNITY)
Admission: EM | Admit: 2019-07-24 | Discharge: 2019-07-25 | Disposition: A | Discharge status: Home / Self Care | Payer: Medicaid Other | Attending: Emergency Medicine | Admitting: Emergency Medicine

## 2019-07-24 ENCOUNTER — Other Ambulatory Visit: Payer: Self-pay

## 2019-07-24 ENCOUNTER — Encounter (HOSPITAL_COMMUNITY): Payer: Self-pay | Admitting: *Deleted

## 2019-07-24 DIAGNOSIS — Y999 Unspecified external cause status: Secondary | ICD-10-CM | POA: Diagnosis not present

## 2019-07-24 DIAGNOSIS — S60032A Contusion of left middle finger without damage to nail, initial encounter: Secondary | ICD-10-CM | POA: Diagnosis not present

## 2019-07-24 DIAGNOSIS — Y9389 Activity, other specified: Secondary | ICD-10-CM | POA: Diagnosis not present

## 2019-07-24 DIAGNOSIS — Y9289 Other specified places as the place of occurrence of the external cause: Secondary | ICD-10-CM | POA: Insufficient documentation

## 2019-07-24 DIAGNOSIS — F17210 Nicotine dependence, cigarettes, uncomplicated: Secondary | ICD-10-CM | POA: Diagnosis not present

## 2019-07-24 DIAGNOSIS — T07XXXA Unspecified multiple injuries, initial encounter: Secondary | ICD-10-CM

## 2019-07-24 DIAGNOSIS — S5012XA Contusion of left forearm, initial encounter: Secondary | ICD-10-CM | POA: Insufficient documentation

## 2019-07-24 DIAGNOSIS — Z79899 Other long term (current) drug therapy: Secondary | ICD-10-CM | POA: Diagnosis not present

## 2019-07-24 DIAGNOSIS — S0083XA Contusion of other part of head, initial encounter: Secondary | ICD-10-CM | POA: Diagnosis not present

## 2019-07-24 DIAGNOSIS — F840 Autistic disorder: Secondary | ICD-10-CM | POA: Insufficient documentation

## 2019-07-24 DIAGNOSIS — F319 Bipolar disorder, unspecified: Secondary | ICD-10-CM | POA: Insufficient documentation

## 2019-07-24 DIAGNOSIS — S60031A Contusion of right middle finger without damage to nail, initial encounter: Secondary | ICD-10-CM | POA: Diagnosis not present

## 2019-07-24 DIAGNOSIS — S0990XA Unspecified injury of head, initial encounter: Secondary | ICD-10-CM | POA: Diagnosis present

## 2019-07-24 MED ORDER — HYDROCODONE-ACETAMINOPHEN 5-325 MG PO TABS
1.0000 | ORAL_TABLET | Freq: Once | ORAL | Status: AC
  Administered 2019-07-24: 1 via ORAL
  Filled 2019-07-24: qty 1
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
## 2019-07-24 NOTE — Progress Notes (Signed)
Patient refused CXR but agreed to all other ordered imaging exams.
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
## 2019-07-25 ENCOUNTER — Emergency Department (HOSPITAL_COMMUNITY): Payer: Medicaid Other

## 2019-07-25 MED ORDER — NAPROXEN 500 MG PO TABS
500.0000 mg | ORAL_TABLET | Freq: Once | ORAL | Status: AC
  Administered 2019-07-25: 500 mg via ORAL
  Filled 2019-07-25: qty 1

## 2019-07-25 MED ORDER — NAPROXEN 500 MG PO TABS: 500.0000 mg | ORAL_TABLET | Freq: Two times a day (BID) | ORAL | 0 refills | Status: DC
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
## 2019-07-31 ENCOUNTER — Encounter (HOSPITAL_COMMUNITY): Payer: Self-pay | Admitting: Emergency Medicine

## 2019-07-31 ENCOUNTER — Other Ambulatory Visit: Payer: Self-pay

## 2019-07-31 ENCOUNTER — Emergency Department (HOSPITAL_COMMUNITY)
Admission: EM | Admit: 2019-07-31 | Discharge: 2019-07-31 | Disposition: A | Discharge status: Home / Self Care | Payer: Medicaid Other | Attending: Emergency Medicine | Admitting: Emergency Medicine

## 2019-07-31 DIAGNOSIS — F840 Autistic disorder: Secondary | ICD-10-CM | POA: Diagnosis not present

## 2019-07-31 DIAGNOSIS — Z590 Homelessness: Secondary | ICD-10-CM | POA: Diagnosis not present

## 2019-07-31 DIAGNOSIS — F17210 Nicotine dependence, cigarettes, uncomplicated: Secondary | ICD-10-CM | POA: Insufficient documentation

## 2019-07-31 DIAGNOSIS — R220 Localized swelling, mass and lump, head: Secondary | ICD-10-CM | POA: Insufficient documentation

## 2019-07-31 DIAGNOSIS — Z79899 Other long term (current) drug therapy: Secondary | ICD-10-CM | POA: Insufficient documentation

## 2019-07-31 MED ORDER — DIPHENHYDRAMINE HCL 25 MG PO CAPS
25.0000 mg | ORAL_CAPSULE | Freq: Once | ORAL | Status: AC
  Administered 2019-07-31: 25 mg via ORAL
  Filled 2019-07-31: qty 1

## 2019-07-31 MED ORDER — DOXYCYCLINE HYCLATE 100 MG PO CAPS: 100.0000 mg | ORAL_CAPSULE | Freq: Two times a day (BID) | ORAL | 0 refills | Status: DC

## 2019-07-31 MED ORDER — PREDNISONE 10 MG PO TABS: 20.0000 mg | ORAL_TABLET | Freq: Two times a day (BID) | ORAL | 0 refills | Status: DC

## 2019-07-31 MED ORDER — DIPHENHYDRAMINE HCL 25 MG PO TABS: 25.0000 mg | ORAL_TABLET | Freq: Four times a day (QID) | ORAL | 0 refills | Status: DC | PRN

## 2019-07-31 MED ORDER — DOXYCYCLINE HYCLATE 100 MG PO TABS
100.0000 mg | ORAL_TABLET | Freq: Once | ORAL | Status: AC
  Administered 2019-07-31: 100 mg via ORAL
  Filled 2019-07-31: qty 1

## 2019-07-31 MED ORDER — PREDNISONE 20 MG PO TABS
60.0000 mg | ORAL_TABLET | Freq: Once | ORAL | Status: AC
  Administered 2019-07-31: 60 mg via ORAL
  Filled 2019-07-31: qty 3
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
## 2019-07-31 NOTE — Discharge Instructions (Addendum)
Take all medicines as prescribed Return immediately to the emergency department you have increased swelling especially in your tongue or your throat or any other difficulty swallowing or breathing Recheck if your symptoms are not improving in 1 to 2 days
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
## 2019-09-15 ENCOUNTER — Other Ambulatory Visit: Payer: Self-pay

## 2019-09-15 ENCOUNTER — Emergency Department (HOSPITAL_COMMUNITY)
Admission: EM | Admit: 2019-09-15 | Discharge: 2019-09-15 | Disposition: A | Discharge status: Home / Self Care | Payer: Medicaid Other | Attending: Emergency Medicine | Admitting: Emergency Medicine

## 2019-09-15 ENCOUNTER — Encounter (HOSPITAL_COMMUNITY): Payer: Self-pay | Admitting: Emergency Medicine

## 2019-09-15 DIAGNOSIS — R058 Other specified cough: Secondary | ICD-10-CM

## 2019-09-15 DIAGNOSIS — R0989 Other specified symptoms and signs involving the circulatory and respiratory systems: Secondary | ICD-10-CM | POA: Diagnosis not present

## 2019-09-15 DIAGNOSIS — R109 Unspecified abdominal pain: Secondary | ICD-10-CM | POA: Diagnosis not present

## 2019-09-15 DIAGNOSIS — Z20822 Contact with and (suspected) exposure to covid-19: Secondary | ICD-10-CM | POA: Diagnosis not present

## 2019-09-15 DIAGNOSIS — Z79899 Other long term (current) drug therapy: Secondary | ICD-10-CM | POA: Diagnosis not present

## 2019-09-15 DIAGNOSIS — R1010 Upper abdominal pain, unspecified: Secondary | ICD-10-CM

## 2019-09-15 DIAGNOSIS — R05 Cough: Secondary | ICD-10-CM | POA: Insufficient documentation

## 2019-09-15 DIAGNOSIS — R067 Sneezing: Secondary | ICD-10-CM | POA: Insufficient documentation

## 2019-09-15 DIAGNOSIS — F17210 Nicotine dependence, cigarettes, uncomplicated: Secondary | ICD-10-CM | POA: Insufficient documentation

## 2019-09-15 DIAGNOSIS — F840 Autistic disorder: Secondary | ICD-10-CM | POA: Insufficient documentation

## 2019-09-15 DIAGNOSIS — R0981 Nasal congestion: Secondary | ICD-10-CM

## 2019-09-15 LAB — CBC
HCT: 42.4 % (ref 39.0–52.0)
Hemoglobin: 14.8 g/dL (ref 13.0–17.0)
MCH: 30.6 pg (ref 26.0–34.0)
MCHC: 34.9 g/dL (ref 30.0–36.0)
MCV: 87.6 fL (ref 80.0–100.0)
Platelets: 187 10*3/uL (ref 150–400)
RBC: 4.84 MIL/uL (ref 4.22–5.81)
RDW: 11.8 % (ref 11.5–15.5)
WBC: 8.1 10*3/uL (ref 4.0–10.5)
nRBC: 0 % (ref 0.0–0.2)

## 2019-09-15 LAB — COMPREHENSIVE METABOLIC PANEL
ALT: 21 U/L (ref 0–44)
AST: 19 U/L (ref 15–41)
Albumin: 4.2 g/dL (ref 3.5–5.0)
Alkaline Phosphatase: 75 U/L (ref 38–126)
Anion gap: 11 (ref 5–15)
BUN: 6 mg/dL (ref 6–20)
CO2: 25 mmol/L (ref 22–32)
Calcium: 9.5 mg/dL (ref 8.9–10.3)
Chloride: 103 mmol/L (ref 98–111)
Creatinine, Ser: 0.87 mg/dL (ref 0.61–1.24)
GFR calc Af Amer: >60 mL/min (ref >60)
GFR calc non Af Amer: >60 mL/min (ref >60)
Glucose, Bld: 93 mg/dL (ref 70–99)
Potassium: 3.5 mmol/L (ref 3.5–5.1)
Sodium: 139 mmol/L (ref 135–145)
Total Bilirubin: 0.5 mg/dL (ref 0.3–1.2)
Total Protein: 7.1 g/dL (ref 6.5–8.1)

## 2019-09-15 LAB — URINALYSIS, ROUTINE W REFLEX MICROSCOPIC
Bilirubin Urine: NEGATIVE
Glucose, UA: NEGATIVE mg/dL
Hgb urine dipstick: NEGATIVE
Ketones, ur: NEGATIVE mg/dL
Leukocytes,Ua: NEGATIVE
Nitrite: NEGATIVE
Protein, ur: NEGATIVE mg/dL
Specific Gravity, Urine: 1.006 (ref 1.005–1.030)
pH: 6 (ref 5.0–8.0)

## 2019-09-15 LAB — LIPASE, BLOOD: Lipase: 18 U/L (ref 11–51)

## 2019-09-15 LAB — SARS CORONAVIRUS 2 BY RT PCR (HOSPITAL ORDER, PERFORMED IN ~~LOC~~ HOSPITAL LAB): SARS Coronavirus 2: NEGATIVE

## 2019-09-15 MED ORDER — LORATADINE 10 MG PO TABS
10.0000 mg | ORAL_TABLET | Freq: Every day | ORAL | Status: DC
  Administered 2019-09-15: 10 mg via ORAL
  Filled 2019-09-15: qty 1

## 2019-09-15 MED ORDER — ACETAMINOPHEN 500 MG PO TABS
1000.0000 mg | ORAL_TABLET | Freq: Once | ORAL | Status: AC
  Administered 2019-09-15: 1000 mg via ORAL
  Filled 2019-09-15: qty 2

## 2019-09-15 MED ORDER — PSEUDOEPHEDRINE HCL ER 120 MG PO TB12
120.0000 mg | ORAL_TABLET | Freq: Two times a day (BID) | ORAL | Status: DC
  Administered 2019-09-15: 120 mg via ORAL
  Filled 2019-09-15: qty 1

## 2019-09-15 MED ORDER — SODIUM CHLORIDE 0.9% FLUSH: 3.0000 mL | Freq: Once | INTRAVENOUS | Status: DC

## 2019-09-15 NOTE — ED Provider Notes (Signed)
[name] EMERGENCY DEPARTMENT Provider Note   CSN: [id number] Arrival date & time: 09/15/19  0146     History Chief Complaint  Patient presents with  . Abdominal Pain    [name] is a 24 y.o. male.  Patient c/o upper abd pain last night - now resolved. Also c/o non prod cough, sinus/nasal congestion for the past few days, and is requesting covid test. No known covid exposure. Denies chest pain or sob. Symptoms acute onset, moderate, persistent. Denies headache. No neck pain or stiffness. No vomiting or diarrhea. No dysuria or gu c/o. No rash.   The history is provided by the patient.  Abdominal Pain Associated symptoms: cough   Associated symptoms: no chest pain, no constipation, no diarrhea, no dysuria, no fever, no shortness of breath, no sore throat and no vomiting        Past Medical History:  Diagnosis Date  . Autism   . Bipolar 1 disorder (HCC)   . Seizures (HCC)    none since 24 years old    Patient Active Problem List   Diagnosis Date Noted  . 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 TOOTH EXTRACTION         No family history on file.  Social History   Tobacco Use  . Smoking status: Current Some Day Smoker    Packs/day: 2.00    Types: Cigarettes  . Smokeless tobacco: Never Used  . Tobacco comment: 4 packs a day  Substance Use Topics  . Alcohol use: No  . Drug use: No    Comment: "BD"- stated he took one time     Home Medications Prior to Admission medications   Medication Sig Start Date End Date Taking? Authorizing Provider  benztropine (COGENTIN) 1 MG tablet Take 1 mg by mouth at bedtime. 07/09/19   [provider]  diphenhydrAMINE (BENADRYL) 25 MG tablet Take 1 tablet (25 mg total) by mouth every 6 (six) hours as needed. 07/31/19   Margarita Grizzle, MD  doxycycline (VIBRAMYCIN) 100 MG capsule Take 1 capsule (100  mg total) by mouth 2 (two) times daily. 07/31/19   Margarita Grizzle, MD  guanFACINE (INTUNIV) 1 MG TB24 ER tablet Take 1 mg by mouth at bedtime. 07/09/19   [provider]  guanFACINE (TENEX) 1 MG tablet Take 1 mg by mouth every morning. 07/09/19   [provider]  naproxen (NAPROSYN) 500 MG tablet Take 1 tablet (500 mg total) by mouth 2 (two) times daily. 07/25/19   Derwood Kaplan, MD  OLANZapine (ZYPREXA) 15 MG tablet Take 15 mg by mouth at bedtime. 07/09/19   [provider]  predniSONE (DELTASONE) 10 MG tablet Take 2 tablets (20 mg total) by mouth 2 (two) times daily with a meal. 07/31/19   Margarita Grizzle, MD  traZODone (DESYREL) 50 MG tablet Take 50-100 mg by mouth at bedtime as needed for sleep.  07/09/19   [provider]    Allergies    Daytrana [methylphenidate]  Review of Systems   Review of Systems  Constitutional: Negative for fever.  HENT: Positive for congestion and rhinorrhea. Negative for sore throat.   Eyes: Negative for pain and redness.  Respiratory: Positive for cough. Negative for shortness of breath.   Cardiovascular: Negative for chest pain.  Gastrointestinal: Positive for abdominal pain. Negative for constipation, diarrhea and vomiting.  Genitourinary: Negative for dysuria and flank pain.  Musculoskeletal: Negative for back pain, neck pain and neck stiffness.  Skin: Negative for rash.  Neurological: Negative for headaches.  Hematological: Does not bruise/bleed easily.  Psychiatric/Behavioral: Negative for confusion.    Physical Exam Updated Vital Signs BP 108/75 (BP Location: Right Arm)   Pulse 77   Temp 98.8 F (37.1 C) (Oral)   Resp 14   Ht 1.753 m (5\' 9" )   Wt 68 kg   SpO2 100%   BMI 22.15 kg/m   Physical Exam Vitals and nursing note reviewed.  Constitutional:      Appearance: Normal appearance. He is well-developed.  HENT:     Head: Atraumatic.     Right Ear: Tympanic membrane normal.     Left Ear: Tympanic membrane  normal.     Nose: Congestion present.     Comments: Mild nasal congestion. No sinus tenderness/pain.     Mouth/Throat:     Mouth: Mucous membranes are moist.     Pharynx: Oropharynx is clear.  Eyes:     General: No scleral icterus.    Conjunctiva/sclera: Conjunctivae normal.     Pupils: Pupils are equal, round, and reactive to light.  Neck:     Trachea: No tracheal deviation.  Cardiovascular:     Rate and Rhythm: Normal rate and regular rhythm.     Pulses: Normal pulses.     Heart sounds: Normal heart sounds. No murmur. No friction rub. No gallop.   Pulmonary:     Effort: Pulmonary effort is normal. No accessory muscle usage or respiratory distress.     Breath sounds: Normal breath sounds.  Abdominal:     General: Bowel sounds are normal. There is no distension.     Palpations: Abdomen is soft. There is no mass.     Tenderness: There is no abdominal tenderness. There is no guarding or rebound.     Hernia: No hernia is present.  Genitourinary:    Comments: No cva tenderness. Musculoskeletal:        General: No swelling.     Cervical back: Normal range of motion and neck supple. No rigidity.  Skin:    General: Skin is warm and dry.     Findings: No rash.  Neurological:     Mental Status: He is alert.     Comments: Alert, speech clear. Steady gait.   Psychiatric:        Mood and Affect: Mood normal.     ED Results / Procedures / Treatments   Labs (all labs ordered are listed, but only abnormal results are displayed) Results for orders placed or performed during the hospital encounter of 09/15/19  Lipase, blood  Result Value Ref Range   Lipase 18 11 - 51 U/L  Comprehensive metabolic panel  Result Value Ref Range   Sodium 139 135 - 145 mmol/L   Potassium 3.5 3.5 - 5.1 mmol/L   Chloride 103 98 - 111 mmol/L   CO2 25 22 - 32 mmol/L   Glucose, Bld 93 70 - 99 mg/dL   BUN 6 6 - 20 mg/dL   Creatinine, Ser 0.87 0.61 - 1.24 mg/dL   Calcium 9.5 8.9 - 10.3 mg/dL   Total  Protein 7.1 6.5 - 8.1 g/dL   Albumin 4.2 3.5 - 5.0 g/dL   AST 19 15 - 41 U/L   ALT 21 0 - 44 U/L   Alkaline Phosphatase 75 38 - 126 U/L   Total Bilirubin 0.5 0.3 - 1.2 mg/dL   GFR calc non  Af Amer >60 >60 mL/min   GFR calc Af Amer >60 >60 mL/min   Anion gap 11 5 - 15  CBC  Result Value Ref Range   WBC 8.1 4.0 - 10.5 K/uL   RBC 4.84 4.22 - 5.81 MIL/uL   Hemoglobin 14.8 13.0 - 17.0 g/dL   HCT 09.8 11.9 - 14.7 %   MCV 87.6 80.0 - 100.0 fL   MCH 30.6 26.0 - 34.0 pg   MCHC 34.9 30.0 - 36.0 g/dL   RDW 82.9 56.2 - 13.0 %   Platelets 187 150 - 400 K/uL   nRBC 0.0 0.0 - 0.2 %  Urinalysis, Routine w reflex microscopic  Result Value Ref Range   Color, Urine YELLOW YELLOW   APPearance CLEAR CLEAR   Specific Gravity, Urine 1.006 1.005 - 1.030   pH 6.0 5.0 - 8.0   Glucose, UA NEGATIVE NEGATIVE mg/dL   Hgb urine dipstick NEGATIVE NEGATIVE   Bilirubin Urine NEGATIVE NEGATIVE   Ketones, ur NEGATIVE NEGATIVE mg/dL   Protein, ur NEGATIVE NEGATIVE mg/dL   Nitrite NEGATIVE NEGATIVE   Leukocytes,Ua NEGATIVE NEGATIVE    EKG None  Radiology No results found.  Procedures Procedures (including critical care time)  Medications Ordered in ED Medications  sodium chloride flush (NS) 0.9 % injection 3 mL (has no administration in time range)  acetaminophen (TYLENOL) tablet 1,000 mg (has no administration in time range)  loratadine (CLARITIN) tablet 10 mg (has no administration in time range)    And  pseudoephedrine (SUDAFED) 12 hr tablet 120 mg (has no administration in time range)    ED Course  I have reviewed the triage vital signs and the nursing notes.  Pertinent labs & imaging results that were available during my care of the patient were reviewed by me and considered in my medical decision making (see chart for details).    MDM Rules/Calculators/A&P                      Labs sent.   Reviewed nursing notes and prior charts for additional history.   Acetaminophen po.  Claritin-d po.   Covid swab sent.   [name] was evaluated in Emergency Department on 09/15/2019 for the symptoms described in the history of present illness. He was evaluated in the context of the global COVID-19 pandemic, which necessitated consideration that the patient might be at risk for infection with the SARS-CoV-2 virus that causes COVID-19. Institutional protocols and algorithms that pertain to the evaluation of patients at risk for COVID-19 are in a state of rapid change based on information released by regulatory bodies including the CDC and federal and state organizations. These policies and algorithms were followed during the patient's care in the ED.  Patient notes earlier abd discomfort has completely resolved. Abd is soft and non tender. Po fluids.   Nasal congestion, rhinorrhea, rare non prod cough - ?viral uri vs allergy related.   Vitals normal, no increased wob.   Tolerating po.  Pt appears stable for d/c.    Final Clinical Impression(s) / ED Diagnoses Final diagnoses:  None    Rx / DC Orders ED Discharge Orders    None       Cathren Laine, MD 09/15/19 [id number]

## 2019-09-15 NOTE — Discharge Instructions (Addendum)
It was our pleasure to provide your ER care today - we hope that you feel better.  Rest. Drink plenty of fluids.   Take zyrtec-d or claritin-d as need for sinus/nasal symptoms.   Your covid test should be resulted in the next few hours - you may check MyChart or call for results.   Return to ER if worse, new symptoms, worsening or severe abdominal pain, persistent vomiting, trouble breathing, or other concern.

## 2019-09-15 NOTE — ED Triage Notes (Signed)
Pt BIB [facility], c/o abdominal pain, diarrhea and nasal congestion x 3 days. Requesting to be tested for covid as well.

## 2019-09-20 ENCOUNTER — Emergency Department (HOSPITAL_COMMUNITY)
Admission: EM | Admit: 2019-09-20 | Discharge: 2019-09-20 | Discharge status: Against Medical Advice | Payer: Medicaid Other | Attending: Emergency Medicine | Admitting: Emergency Medicine

## 2019-09-20 ENCOUNTER — Ambulatory Visit (HOSPITAL_COMMUNITY)
Admission: RE | Admit: 2019-09-20 | Discharge: 2019-09-20 | Disposition: A | Discharge status: Home / Self Care | Payer: Medicaid Other | Attending: Psychiatry | Admitting: Psychiatry

## 2019-09-20 ENCOUNTER — Ambulatory Visit (HOSPITAL_COMMUNITY)
Admission: RE | Admit: 2019-09-20 | Discharge: 2019-09-20 | Disposition: A | Discharge status: Home / Self Care | Payer: Medicaid Other | Source: Home / Self Care | Attending: Psychiatry | Admitting: Psychiatry

## 2019-09-20 ENCOUNTER — Encounter (HOSPITAL_COMMUNITY): Payer: Self-pay

## 2019-09-20 DIAGNOSIS — F17210 Nicotine dependence, cigarettes, uncomplicated: Secondary | ICD-10-CM | POA: Diagnosis not present

## 2019-09-20 DIAGNOSIS — F329 Major depressive disorder, single episode, unspecified: Secondary | ICD-10-CM

## 2019-09-20 DIAGNOSIS — F319 Bipolar disorder, unspecified: Secondary | ICD-10-CM | POA: Diagnosis not present

## 2019-09-20 DIAGNOSIS — Z79899 Other long term (current) drug therapy: Secondary | ICD-10-CM | POA: Diagnosis not present

## 2019-09-20 DIAGNOSIS — F840 Autistic disorder: Secondary | ICD-10-CM | POA: Insufficient documentation

## 2019-09-20 DIAGNOSIS — R519 Headache, unspecified: Secondary | ICD-10-CM | POA: Insufficient documentation

## 2019-09-20 DIAGNOSIS — R45851 Suicidal ideations: Secondary | ICD-10-CM | POA: Insufficient documentation

## 2019-09-20 LAB — CBC WITH DIFFERENTIAL/PLATELET
Abs Immature Granulocytes: 0.05 10*3/uL (ref 0.00–0.07)
Basophils Absolute: 0 10*3/uL (ref 0.0–0.1)
Basophils Relative: 1 %
Eosinophils Absolute: 0.1 10*3/uL (ref 0.0–0.5)
Eosinophils Relative: 1 %
HCT: 41.4 % (ref 39.0–52.0)
Hemoglobin: 15.2 g/dL (ref 13.0–17.0)
Immature Granulocytes: 1 %
Lymphocytes Relative: 44 %
Lymphs Abs: 3.5 10*3/uL (ref 0.7–4.0)
MCH: 32.6 pg (ref 26.0–34.0)
MCHC: 36.7 g/dL — ABNORMAL HIGH (ref 30.0–36.0)
MCV: 88.8 fL (ref 80.0–100.0)
Monocytes Absolute: 0.8 10*3/uL (ref 0.1–1.0)
Monocytes Relative: 10 %
Neutro Abs: 3.6 10*3/uL (ref 1.7–7.7)
Neutrophils Relative %: 43 %
Platelets: 188 10*3/uL (ref 150–400)
RBC: 4.66 MIL/uL (ref 4.22–5.81)
RDW: 11.7 % (ref 11.5–15.5)
WBC: 8 10*3/uL (ref 4.0–10.5)
nRBC: 0 % (ref 0.0–0.2)

## 2019-09-20 LAB — COMPREHENSIVE METABOLIC PANEL
ALT: 19 U/L (ref 0–44)
AST: 18 U/L (ref 15–41)
Albumin: 4 g/dL (ref 3.5–5.0)
Alkaline Phosphatase: 67 U/L (ref 38–126)
Anion gap: 11 (ref 5–15)
BUN: 14 mg/dL (ref 6–20)
CO2: 26 mmol/L (ref 22–32)
Calcium: 9.1 mg/dL (ref 8.9–10.3)
Chloride: 104 mmol/L (ref 98–111)
Creatinine, Ser: 0.7 mg/dL (ref 0.61–1.24)
GFR calc Af Amer: >60 mL/min (ref >60)
GFR calc non Af Amer: >60 mL/min (ref >60)
Glucose, Bld: 83 mg/dL (ref 70–99)
Potassium: 3.6 mmol/L (ref 3.5–5.1)
Sodium: 141 mmol/L (ref 135–145)
Total Bilirubin: 0.8 mg/dL (ref 0.3–1.2)
Total Protein: 6.8 g/dL (ref 6.5–8.1)

## 2019-09-20 LAB — ETHANOL: Alcohol, Ethyl (B): <10 mg/dL (ref <10)

## 2019-09-20 MED ORDER — PROCHLORPERAZINE EDISYLATE 10 MG/2ML IJ SOLN
10.0000 mg | Freq: Once | INTRAMUSCULAR | Status: AC
  Administered 2019-09-20: 10 mg via INTRAVENOUS
  Filled 2019-09-20: qty 2

## 2019-09-20 MED ORDER — DIPHENHYDRAMINE HCL 50 MG/ML IJ SOLN
25.0000 mg | Freq: Once | INTRAMUSCULAR | Status: AC
  Administered 2019-09-20: 25 mg via INTRAVENOUS
  Filled 2019-09-20: qty 1

## 2019-09-20 NOTE — BH Assessment (Signed)
Assessment Note  [name] is an 24 y.o. male, who present voluntary and unaccompanied to [facility]. Clinician asked the pt, "what brought you to the hospital?" Pt reported, he is suicidal due to his family, pushing him to a point he doesn't want to live anymore. Pt reported, he was suicidal with a plan of jumping from a bridge. Pt reported, since coming to [facility] he has been feeling better. Pt reported, he realizes his family is trying to be there for me, help him this time. Pt reported, feeling,"a little, 5-10% suicidal. Pt denies, SI, HI, AVH, self-injurious behaviors and access to weapons.  Pt reported, he drank a sparkling tea. Pt reported, smoking a blunt, two days ago. Pt reported, he is linked to [facility] for medication management, pt reported, he hasn't taking he medications in a month because he does not like how they make him feel. Pt reported, he missed his appointment on the first with is psychiatrist, had it rescheduled for 10/07/2019.   Pt presents quiet, awake with logical, coherent speech. Pt's eye contact was fair. Pt's mood was pleasant, sad. Pt's affect was congruent with mood. Pt's judgement was partial. Pt was oriented x4. Pt's concentration, insight and impulse control was fair. Pt reported, if discharged from [facility] he could contract for safety.   See Doran Clay, RN note.   *Pt reported, his mother is his legal guardian however there is no paperwork in his file. Pt declined for clinician to call his mother due to her being sleep.*    Diagnosis: Unspecified Depressive Disorder.   Past Medical History:  Past Medical History:  Diagnosis Date  . Autism   . Bipolar 1 disorder (HCC)   . Seizures (HCC)    none since 24 years old    Past Surgical History:  Procedure Laterality Date  . TOOTH EXTRACTION      Family History: No family history on file.  Social History:  reports that he has been smoking cigarettes. He has been smoking about 4.00 packs per day. He has  never used smokeless tobacco. He reports that he does not drink alcohol or use drugs.  Additional Social History:  Alcohol / Drug Use Pain Medications: See MAR Prescriptions: SeeMAR Over the Counter: See MAR History of alcohol / drug use?: Yes Substance #1 Name of Substance 1: Alcohol. 1 - Age of First Use: UTA 1 - Amount (size/oz): Pt reported, he drank a sparkling tea. 1 - Frequency: Pt reported, barely drinking. 1 - Duration: UTA 1 - Last Use / Amount: UTA Substance #2 Name of Substance 2: Marijuana. 2 - Age of First Use: UTA 2 - Amount (size/oz): Pt reported, smoking a blunt, two days ago. 2 - Frequency: Per very often. 2 - Duration: Ongoing. 2 - Last Use / Amount: Two days.  CIWA: CIWA-Ar BP: 116/76 Pulse Rate: 88 COWS:    Allergies:  Allergies  Allergen Reactions  . Daytrana [Methylphenidate] Rash    Home Medications: (Not in a hospital admission)   OB/GYN Status:  No LMP for male patient.  General Assessment Data Location of Assessment: [facility] Assessment Services TTS Assessment: In system Is this a Tele or Face-to-Face Assessment?: Face-to-Face Is this an Initial Assessment or a Re-assessment for this encounter?: Initial Assessment Patient Accompanied by:: N/A Language Other than English: No Living Arrangements: Other (Comment)(Sister.) What gender do you identify as?: Male Marital status: Single Living Arrangements: Other relatives Can pt return to current living arrangement?: Yes Admission Status: Voluntary Is patient  capable of signing voluntary admission?: Yes Referral Source: Self/Family/Friend Insurance type: [facility].  Medical Screening Exam [facility] Walk-in ONLY) Medical Exam completed: Yes  Crisis Care Plan Living Arrangements: Other relatives Legal Guardian: Other:(Per pt, mother however there is no documation of legal guard) Name of Psychiatrist: [facility].  Education Status Is patient currently in school?: No Is the patient  employed, unemployed or receiving disability?: Receiving disability income  Risk to self with the past 6 months Suicidal Ideation: No-Not Currently/Within Last 6 Months Has patient been a risk to self within the past 6 months prior to admission? : Yes Suicidal Intent: No-Not Currently/Within Last 6 Months Has patient had any suicidal intent within the past 6 months prior to admission? : No Is patient at risk for suicide?: No Suicidal Plan?: No-Not Currently/Within Last 6 Months Has patient had any suicidal plan within the past 6 months prior to admission? : (UTA) Access to Means: Yes Specify Access to Suicidal Means: Pt has access to bridges.  What has been your use of drugs/alcohol within the last 12 months?: Alcohol and marijuana.  Previous Attempts/Gestures: Yes How many times?: 1 Other Self Harm Risks: SI, depressive symptoms.  Triggers for Past Attempts: Unknown Intentional Self Injurious Behavior: None(Pt denies.) Recent stressful life event(s): Other (Comment)(Sister and mother yelling at him, loosing best friend at 83.) Persecutory voices/beliefs?: No Depression: Yes Depression Symptoms: Despondent, Insomnia, Isolating, Loss of interest in usual pleasures Substance abuse history and/or treatment for substance abuse?: No Suicide prevention information given to non-admitted patients: Not applicable  Risk to Others within the past 6 months Homicidal Ideation: No(Pt denies. ) Does patient have any lifetime risk of violence toward others beyond the six months prior to admission? : Yes (comment)(Per pt towards family. ) Thoughts of Harm to Others: No(Pt denies. ) Current Homicidal Intent: No Current Homicidal Plan: No Access to Homicidal Means: No Identified Victim: NA History of harm to others?: Yes Assessment of Violence: (UTA) Violent Behavior Description: Per pt, towards family. Does patient have access to weapons?: No Criminal Charges Pending?: No Does patient have a  court date: No Is patient on probation?: No  Psychosis Hallucinations: None noted Delusions: None noted  Mental Status Report Appearance/Hygiene: Unremarkable Eye Contact: Fair Motor Activity: Unremarkable Speech: Logical/coherent Level of Consciousness: Quiet/awake Mood: Pleasant, Sad Affect: Other (Comment)(congruent with mood.) Anxiety Level: None Thought Processes: Coherent, Relevant Judgement: Partial Orientation: Person, Place, Time, Situation Obsessive Compulsive Thoughts/Behaviors: None  Cognitive Functioning Concentration: Fair Memory: Recent Intact Is patient IDD: No Insight: Fair Impulse Control: Fair Appetite: Poor Sleep: Decreased Total Hours of Sleep: ("Barely.") Vegetative Symptoms: None  ADLScreening [facility] Assessment Services) Patient's cognitive ability adequate to safely complete daily activities?: Yes Patient able to express need for assistance with ADLs?: Yes Independently performs ADLs?: Yes (appropriate for developmental age)     Prior Outpatient Therapy Prior Outpatient Therapy: Yes Prior Therapy Dates: Current. Prior Therapy Facilty/Provider(s): [facility].  Reason for Treatment: Medication management.  ADL Screening (condition at time of admission) Patient's cognitive ability adequate to safely complete daily activities?: Yes Is the patient deaf or have difficulty hearing?: No Does the patient have difficulty seeing, even when wearing glasses/contacts?: No Does the patient have difficulty concentrating, remembering, or making decisions?: No Patient able to express need for assistance with ADLs?: Yes Does the patient have difficulty dressing or bathing?: No Independently performs ADLs?: Yes (appropriate for developmental age) Does the patient have difficulty walking or climbing stairs?: No Weakness of Legs: None Weakness of Arms/Hands: None  Home Assistive Devices/Equipment Home Assistive Devices/Equipment: Systems developer (For Healthcare) Does Patient Have a Medical Advance Directive?: No          Disposition: Adaku Anike, NP recommends overnight observation and to be reassessed by psychiatry however pt refused and signed himself out AMA.     On Site Evaluation by: Vertell Novak, MS, [facility], [facility].  Reviewed with Physician: Talbot Grumbling, NP.   Vertell Novak 09/20/2019 4:49 AM.    Vertell Novak, MS, [facility], [facility] Triage Specialist [phone]

## 2019-09-20 NOTE — Progress Notes (Signed)
After speaking with provider pt informed staff he did not want to stay and asked to be allowed to leave. This RN spoke with provider Adah NP who stated he could leave AMA with transportation.  Pt contacted 'sister' via speakerphone who he believed was at [facility] for transportation. Male stated she was 'back at the room' and couldn't come get him. This Clinical research associate heard pt tell person on phone he regretted his previous comments about wanting to die and he no longer felt that way, that he 'only had one family and wanted to live for [his] family'. Pt stated he would panhandle back to where he is staying ([address]) because he 'owed [his] sister money anyway'. This Clinical research associate contacted [employer] for instruction. Per Selena Batten, staff offered pt a cab voucher which he declined. Selena Batten said that would be okay. Pt remained calm & cooperative with no signs of AVH or RIS and denying SI/HI. All belongings returned to him & he left with no signs of distress.

## 2019-09-20 NOTE — BHH Counselor (Signed)
Patient accessed [facility] after leaving [facility] ED AMA. This counselor saw patient with Malachy Chamber, PMHNP. Patient left [facility] AMA earlier this morning. Patient states "I need help." Patient is belligerent. He states he is suicidal but does not have a plan or intent. Patient reports he has a psychiatrist and an upcoming appointment in a few weeks. He states he needs substance use treatment.   Per Malachy Chamber, PMHNP patient to be discharged with outpatient resources. This counselor provided resources. Patient refusing to leave property. Security to assist with patient leaving.

## 2019-09-20 NOTE — ED Notes (Signed)
PT have been made aware of urine sample. Urinal in hand. Will info staff when able to provide  

## 2019-09-20 NOTE — H&P (Signed)
Behavioral Health Medical Screening Exam  [name] is a 25 y.o. male who presents to [facility] as a walk-in brought in by [facility] for SI. Pt states "I'm feeling suicidal and my family is pushing me to the point where I don't want to live anymore" Pt reports he was suicidal earlier with plans to jump off the bridge. Pt reports he feels a little better now that he has talked to someone. Pt states He realizes that his family id trying to be there for him and help him though he doesn't always feel that way. Pt endorses feeling a little SI, denies HI, self harm, AVH, paranoia and prior SA.  Patient denies access to guns or weapons. Pt reports smoking a blunt two days ago and drinking "sparkling tea" yesterday. He gets outpatient services from [place] but states he has not taken any medication in 1 month due to not liking the way it makes him feel. Pt also states he is trying to save his medication so he does not run out because he missed his appointment and got it rescheduled for June 21.   During evaluation pt is sitting; he is alert/oriented x 4; cooperative; and mood is depressed congruent with affect. Pt is speaking in a clear tone at moderate volume, and normal pace; with fair eye contact. His thought process is coherent and relevant; There is no indication that he is currently responding to internal/external stimuli or experiencing delusional thought content. Pt's insight, judgement and impulse control is fair at this time.   Total Time spent with patient: 30 minutes  Psychiatric Specialty Exam  Psychiatric Specialty Exam: Physical Exam  Review of Systems  Blood pressure (!) 136/91, pulse (!) 111, temperature 98.2 F (36.8 C), temperature source Oral, resp. rate 20, SpO2 99 %.There is no height or weight on file to calculate BMI.  General Appearance: Casual  Eye Contact:  Fair  Speech:  Normal Rate  Volume:  Normal  Mood:  Depressed  Affect:  Congruent and Depressed  Thought Process:  Coherent and  Descriptions of Associations: Intact  Orientation:  Full (Time, Place, and Person)  Thought Content:  Logical  Suicidal Thoughts:  Yes.  with intent/plan  Homicidal Thoughts:  No  Memory:  Recent;   Fair  Judgement:  Fair  Insight:  Fair  Psychomotor Activity:  Normal  Concentration:  Concentration: Good  Recall:  Good  Fund of Knowledge:  Good  Language:  Good  Akathisia:  No  Handed:  Right  AIMS (if indicated):     Assets:  Communication Skills Desire for Improvement Financial Resources/Insurance Housing Social Support  ADL's:  Intact  Cognition:  WNL  Sleep:      Physical Exam: Physical Exam HENT:     Head: Normocephalic.  Eyes:     Pupils: Pupils are equal, round, and reactive to light.  Pulmonary:     Effort: Pulmonary effort is normal.  Musculoskeletal:        General: Normal range of motion.     Cervical back: Normal range of motion.  Neurological:     Mental Status: He is alert and oriented to person, place, and time.  Psychiatric:        Attention and Perception: Attention normal.        Mood and Affect: Mood is depressed.        Speech: Speech normal.        Behavior: Behavior normal.        Thought Content: Thought content  includes suicidal ideation.        Cognition and Memory: Cognition normal.        Judgment: Judgment is impulsive.    Review of Systems  Psychiatric/Behavioral: Positive for depression and suicidal ideas. Negative for hallucinations, memory loss and substance abuse. The patient has insomnia. The patient is not nervous/anxious.   All other systems reviewed and are negative.  Blood pressure 116/76, pulse 88, temperature 98.2 F (36.8 C), temperature source Oral, resp. rate 18, SpO2 98 %. There is no height or weight on file to calculate BMI.  Musculoskeletal: Strength & Muscle Tone: within normal limits Gait & Station: normal Patient leans: N/A   Recommendations:  Based on my evaluation the patient does not appear to have an  emergency medical condition.   Disposition: Recommend overnight observation for monitoring and stabilization Supportive therapy provided about ongoing stressors.  Pt refused disposition and signed himself out AMA  Wandra Arthurs, NP 09/20/2019, 6:40 AM

## 2019-09-20 NOTE — ED Triage Notes (Signed)
Arrived pOV from home. Patient reports right temporal headache that started the morning of 09/19/2019. Patient did not take any medications at home for headache.

## 2019-09-20 NOTE — ED Notes (Signed)
Ham sandwich given to PT

## 2019-09-20 NOTE — ED Provider Notes (Addendum)
[facility] DEPT Provider Note   CSN: [id number] Arrival date & time: 09/20/19  0428     History Chief Complaint  Patient presents with  . Headache    [name] is a 24 y.o. male.  HPI 24 year old male with a history of seizures, autism, bipolar 1 presents to the ER for headache and suicidal ideations.  Patient states that "his mom wants me to stay here for 3 days because I have been wanting to hurt myself".  He states that he has been going through a lot and has been having an increase in suicidal thoughts.  He has thought of jumping off a bridge.  He also endorses headaches, that sometimes make him want to "kill himself".  His current headache is a 4/10, with no vision changes, nausea, vomiting, dizziness, fevers, syncope.  He has no other medical issues other than "nosebleeds".  No drug or alcohol use.  No HI.  No hallucinations.    Past Medical History:  Diagnosis Date  . Autism   . Bipolar 1 disorder ([place])   . Seizures ([place])    none since 24 years old    Patient Active Problem List   Diagnosis Date Noted  . 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 TOOTH EXTRACTION         No family history on file.  Social History   Tobacco Use  . Smoking status: Current Some Day Smoker    Packs/day: 4.00    Types: Cigarettes  . Smokeless tobacco: Never Used  . Tobacco comment: 4 packs a day  Substance Use Topics  . Alcohol use: No  . Drug use: No    Comment: "BD"- stated he took one time     Home Medications Prior to Admission medications   Medication Sig Start Date End Date Taking? Authorizing Provider  benztropine (COGENTIN) 1 MG tablet Take 1 mg by mouth at bedtime. 07/09/19   [provider]  diphenhydrAMINE (BENADRYL) 25 MG tablet Take 1 tablet (25 mg total) by mouth every 6 (six) hours as needed. 07/31/19   Pattricia Boss, MD    doxycycline (VIBRAMYCIN) 100 MG capsule Take 1 capsule (100 mg total) by mouth 2 (two) times daily. 07/31/19   Pattricia Boss, MD  guanFACINE (INTUNIV) 1 MG TB24 ER tablet Take 1 mg by mouth at bedtime. 07/09/19   [provider]  guanFACINE (TENEX) 1 MG tablet Take 1 mg by mouth every morning. 07/09/19   [provider]  naproxen (NAPROSYN) 500 MG tablet Take 1 tablet (500 mg total) by mouth 2 (two) times daily. 07/25/19   Varney Biles, MD  OLANZapine (ZYPREXA) 15 MG tablet Take 15 mg by mouth at bedtime. 07/09/19   [provider]  predniSONE (DELTASONE) 10 MG tablet Take 2 tablets (20 mg total) by mouth 2 (two) times daily with a meal. 07/31/19   Pattricia Boss, MD  traZODone (DESYREL) 50 MG tablet Take 50-100 mg by mouth at bedtime as needed for sleep.  07/09/19   [provider]    Allergies    Daytrana [methylphenidate]  Review of Systems   Review of Systems  Constitutional: Negative for chills and fever.  HENT: Negative for ear pain and sore throat.   Eyes: Negative for pain and visual disturbance.  Respiratory: Negative for cough and shortness of breath.   Cardiovascular: Negative for chest pain and palpitations.  Gastrointestinal: Negative for abdominal pain and vomiting.  Genitourinary: Negative for dysuria and hematuria.  Musculoskeletal: Negative for arthralgias and back pain.  Skin: Negative for color change and rash.  Neurological: Positive for headaches. Negative for dizziness, seizures, syncope and weakness.  All other systems reviewed and are negative.   Physical Exam Updated Vital Signs BP 127/76 (BP Location: Left Arm)   Pulse 65   Temp 97.8 F (36.6 C) (Oral)   Resp 15   Ht 5\' 9"  (1.753 m)   Wt 68 kg   SpO2 99%   BMI 22.15 kg/m   Physical Exam Vitals and nursing note reviewed.  Constitutional:      Appearance: He is well-developed.  HENT:     Head: Normocephalic and atraumatic.     Mouth/Throat:     Mouth: Mucous  membranes are moist.  Eyes:     General: No visual field deficit.    Extraocular Movements: Extraocular movements intact.     Conjunctiva/sclera: Conjunctivae normal.  Cardiovascular:     Rate and Rhythm: Normal rate and regular rhythm.     Heart sounds: Normal heart sounds. No murmur.  Pulmonary:     Effort: Pulmonary effort is normal. No respiratory distress.     Breath sounds: Normal breath sounds.  Abdominal:     Palpations: Abdomen is soft.     Tenderness: There is no abdominal tenderness.  Musculoskeletal:     Cervical back: Normal range of motion and neck supple.  Skin:    General: Skin is warm and dry.     Capillary Refill: Capillary refill takes less than 2 seconds.     Findings: No erythema or rash.  Neurological:     Mental Status: He is alert and oriented to person, place, and time.     GCS: GCS eye subscore is 4. GCS verbal subscore is 5. GCS motor subscore is 6.     Cranial Nerves: No cranial nerve deficit.     Sensory: No sensory deficit.     Motor: No weakness.  Psychiatric:        Mood and Affect: Mood normal.        Behavior: Behavior normal.     ED Results / Procedures / Treatments   Labs (all labs ordered are listed, but only abnormal results are displayed) Labs Reviewed  CBC WITH DIFFERENTIAL/PLATELET - Abnormal; Notable for the following components:      Result Value   MCHC 36.7 (*)    All other components within normal limits  SARS CORONAVIRUS 2 BY RT PCR (HOSPITAL ORDER, PERFORMED IN [facility])  COMPREHENSIVE METABOLIC PANEL  ETHANOL  RAPID URINE DRUG SCREEN, HOSP PERFORMED  URINALYSIS, ROUTINE W REFLEX MICROSCOPIC    EKG None  Radiology No results found.  Procedures Procedures (including critical care time)  Medications Ordered in ED Medications  prochlorperazine (COMPAZINE) injection 10 mg (10 mg Intravenous Given 09/20/19 0741)  diphenhydrAMINE (BENADRYL) injection 25 mg (25 mg Intravenous Given 09/20/19 0744)    ED  Course  I have reviewed the triage vital signs and the nursing notes.  Pertinent labs & imaging results that were available during my care of the patient were reviewed by me and considered in my medical decision making (see chart for details).    MDM Rules/Calculators/A&P                      67:6 AM: 24 year old with headache and suicidal ideations.  On presentation, he is  alert and oriented x3, no acute distress, nontoxic-appearing, speaking full sentences.  Vitals reassuring.  Physical exam benign, no neuro deficits.  No recent head injuries. Not on blood thinners. Will treat with migraine cocktail as patient states that Tylenol/ibuprofen generally do not work for his headaches.  He is not on any migraine medication.  Will order ED psych labs, and consult TTS.    8:06 AM: Nursing staff informed me that the patient has left the ER stating "I don't want to be here anymore" I did not get a chance to speak with him.  He had received his migraine cocktail. Per chart review, patient had been seen by [facility] earlier this morning at 2 AM, they recommended overnight observation for monitoring and stabilization.  He however chose to leave AMA.  He does not meet IVC criteria, in addition to being evaluated by [facility] with the same complaint.  I attempted to call his mother to discuss her concerns but was unable to reach her.  Left voicemail.  I personally reviewed his lab work, CMP unremarkable, ethanol negative.  CBC without leukocytosis, normal hemoglobin.  Patient refused Covid test, urine was not collected.  Final Clinical Impression(s) / ED Diagnoses Final diagnoses:  Suicidal ideation  Nonintractable headache, unspecified chronicity pattern, unspecified headache type    Rx / DC Orders ED Discharge Orders    None           Mare Ferrari, PA-C 09/20/19 3335    Benjiman Core, MD 09/20/19 [phone]

## 2019-09-20 NOTE — ED Notes (Signed)
Patient states he didn't want to be here anymore-stated the IV was making him want to vomit-encouraged patient to stay-patient refused

## 2019-10-02 ENCOUNTER — Emergency Department (HOSPITAL_COMMUNITY): Payer: Medicaid Other

## 2019-10-02 ENCOUNTER — Encounter (HOSPITAL_COMMUNITY): Payer: Self-pay | Admitting: Emergency Medicine

## 2019-10-02 ENCOUNTER — Emergency Department (HOSPITAL_COMMUNITY)
Admission: EM | Admit: 2019-10-02 | Discharge: 2019-10-02 | Disposition: A | Discharge status: Home / Self Care | Payer: Medicaid Other | Attending: Emergency Medicine | Admitting: Emergency Medicine

## 2019-10-02 ENCOUNTER — Other Ambulatory Visit: Payer: Self-pay

## 2019-10-02 DIAGNOSIS — F840 Autistic disorder: Secondary | ICD-10-CM | POA: Diagnosis not present

## 2019-10-02 DIAGNOSIS — Z79899 Other long term (current) drug therapy: Secondary | ICD-10-CM | POA: Insufficient documentation

## 2019-10-02 DIAGNOSIS — F17210 Nicotine dependence, cigarettes, uncomplicated: Secondary | ICD-10-CM | POA: Diagnosis not present

## 2019-10-02 DIAGNOSIS — R1013 Epigastric pain: Secondary | ICD-10-CM | POA: Insufficient documentation

## 2019-10-02 DIAGNOSIS — R0789 Other chest pain: Secondary | ICD-10-CM | POA: Insufficient documentation

## 2019-10-02 DIAGNOSIS — R0602 Shortness of breath: Secondary | ICD-10-CM | POA: Insufficient documentation

## 2019-10-02 LAB — CBC
HCT: 42.2 % (ref 39.0–52.0)
Hemoglobin: 14.7 g/dL (ref 13.0–17.0)
MCH: 30.6 pg (ref 26.0–34.0)
MCHC: 34.8 g/dL (ref 30.0–36.0)
MCV: 87.7 fL (ref 80.0–100.0)
Platelets: 171 10*3/uL (ref 150–400)
RBC: 4.81 MIL/uL (ref 4.22–5.81)
RDW: 11.6 % (ref 11.5–15.5)
WBC: 8.8 10*3/uL (ref 4.0–10.5)
nRBC: 0 % (ref 0.0–0.2)

## 2019-10-02 LAB — TROPONIN I (HIGH SENSITIVITY)
Troponin I (High Sensitivity): 6 ng/L (ref <18)
Troponin I (High Sensitivity): 6 ng/L (ref <18)

## 2019-10-02 LAB — PROTIME-INR
INR: 1 (ref 0.8–1.2)
Prothrombin Time: 12.6 seconds (ref 11.4–15.2)

## 2019-10-02 LAB — BASIC METABOLIC PANEL
Anion gap: 9 (ref 5–15)
BUN: 11 mg/dL (ref 6–20)
CO2: 26 mmol/L (ref 22–32)
Calcium: 9.5 mg/dL (ref 8.9–10.3)
Chloride: 105 mmol/L (ref 98–111)
Creatinine, Ser: 0.84 mg/dL (ref 0.61–1.24)
GFR calc Af Amer: >60 mL/min (ref >60)
GFR calc non Af Amer: >60 mL/min (ref >60)
Glucose, Bld: 87 mg/dL (ref 70–99)
Potassium: 3.8 mmol/L (ref 3.5–5.1)
Sodium: 140 mmol/L (ref 135–145)

## 2019-10-02 LAB — HEPATIC FUNCTION PANEL
ALT: 22 U/L (ref 0–44)
AST: 21 U/L (ref 15–41)
Albumin: 4 g/dL (ref 3.5–5.0)
Alkaline Phosphatase: 65 U/L (ref 38–126)
Bilirubin, Direct: 0.1 mg/dL (ref 0.0–0.2)
Indirect Bilirubin: 1.1 mg/dL — ABNORMAL HIGH (ref 0.3–0.9)
Total Bilirubin: 1.2 mg/dL (ref 0.3–1.2)
Total Protein: 6.7 g/dL (ref 6.5–8.1)

## 2019-10-02 LAB — LIPASE, BLOOD: Lipase: 21 U/L (ref 11–51)

## 2019-10-02 MED ORDER — SODIUM CHLORIDE 0.9% FLUSH: 3.0000 mL | Freq: Once | INTRAVENOUS | Status: DC

## 2019-10-02 MED ORDER — FAMOTIDINE 20 MG PO TABS
20.0000 mg | ORAL_TABLET | Freq: Two times a day (BID) | ORAL | 0 refills | Status: DC
Start: 2019-10-02 — End: 2020-05-27

## 2019-10-02 MED ORDER — IOHEXOL 300 MG/ML  SOLN
100.0000 mL | Freq: Once | INTRAMUSCULAR | Status: AC | PRN
  Administered 2019-10-02: 100 mL via INTRAVENOUS

## 2019-10-02 NOTE — ED Triage Notes (Signed)
Patient arrived with EMS from a parking lot reports low chest/epigastric pain onset this evening with mild SOB , no emesis or diaphoresis.

## 2019-10-02 NOTE — ED Notes (Signed)
Pt stated they were stepping outside  

## 2019-10-02 NOTE — ED Provider Notes (Signed)
[name] EMERGENCY DEPARTMENT Provider Note   CSN: [id number] Arrival date & time: 10/02/19  0249     History Chief Complaint  Patient presents with  . Chest Pain    [name] is a 24 y.o. male.  Patient brought in by EMS.  Patient with a complaint of low anterior chest pain epigastric abdominal pain that started in the evening.  Associated with mild shortness of breath no nausea vomiting or diarrhea.  No diaphoresis.  Past medical history is significant for history of seizures as a child but none since age 54.  Autism and bipolar disorder.  Patient states he still having the pain he points to the epigastric part of his abdomen.        Past Medical History:  Diagnosis Date  . Autism   . Bipolar 1 disorder (HCC)   . Seizures (HCC)    none since 24 years old    Patient Active Problem List   Diagnosis Date Noted  . 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 TOOTH EXTRACTION         No family history on file.  Social History   Tobacco Use  . Smoking status: Current Some Day Smoker    Packs/day: 4.00    Types: Cigarettes  . Smokeless tobacco: Never Used  . Tobacco comment: 4 packs a day  Vaping Use  . Vaping Use: Former  Substance Use Topics  . Alcohol use: No  . Drug use: No    Comment: "BD"- stated he took one time     Home Medications Prior to Admission medications   Medication Sig Start Date End Date Taking? Authorizing Provider  benztropine (COGENTIN) 1 MG tablet Take 1 mg by mouth at bedtime. 07/09/19  Yes [provider]  diphenhydrAMINE (BENADRYL) 25 MG tablet Take 1 tablet (25 mg total) by mouth every 6 (six) hours as needed. 07/31/19  Yes Margarita Grizzle, MD  doxycycline (VIBRAMYCIN) 100 MG capsule Take 1 capsule (100 mg total) by mouth 2 (two) times daily. 07/31/19  Yes Margarita Grizzle, MD  guanFACINE (INTUNIV) 1 MG TB24 ER  tablet Take 1 mg by mouth at bedtime. 07/09/19  Yes [provider]  guanFACINE (TENEX) 1 MG tablet Take 1 mg by mouth every morning. 07/09/19  Yes [provider]  naproxen (NAPROSYN) 500 MG tablet Take 1 tablet (500 mg total) by mouth 2 (two) times daily. 07/25/19  Yes Nanavati, Ankit, MD  OLANZapine (ZYPREXA) 15 MG tablet Take 15 mg by mouth at bedtime. 07/09/19  Yes [provider]  predniSONE (DELTASONE) 10 MG tablet Take 2 tablets (20 mg total) by mouth 2 (two) times daily with a meal. 07/31/19  Yes Margarita Grizzle, MD  traZODone (DESYREL) 50 MG tablet Take 50-100 mg by mouth at bedtime as needed for sleep.  07/09/19  Yes [provider]    Allergies    Daytrana [methylphenidate]  Review of Systems   Review of Systems  Constitutional: Negative for chills and fever.  HENT: Negative for congestion, rhinorrhea and sore throat.   Eyes: Negative for visual disturbance.  Respiratory: Negative for cough and shortness of breath.   Cardiovascular: Positive for chest pain. Negative for leg swelling.  Gastrointestinal: Negative for abdominal pain, diarrhea, nausea and vomiting.  Genitourinary: Negative for dysuria.  Musculoskeletal: Negative for back pain and neck pain.  Skin: Negative for rash.  Neurological: Negative  for dizziness, light-headedness and headaches.  Hematological: Does not bruise/bleed easily.  Psychiatric/Behavioral: Negative for confusion.    Physical Exam Updated Vital Signs BP 127/83   Pulse 61   Temp 98.5 F (36.9 C) (Oral)   Resp 12   Ht 1.753 m (5\' 9" )   Wt 68 kg   SpO2 100%   BMI 22.15 kg/m   Physical Exam Vitals and nursing note reviewed.  Constitutional:      Appearance: Normal appearance. He is well-developed.  HENT:     Head: Normocephalic and atraumatic.     Mouth/Throat:     Mouth: Mucous membranes are moist.  Eyes:     Conjunctiva/sclera: Conjunctivae normal.     Pupils: Pupils are equal, round, and reactive to  light.  Cardiovascular:     Rate and Rhythm: Normal rate and regular rhythm.     Heart sounds: No murmur heard.   Pulmonary:     Effort: Pulmonary effort is normal. No respiratory distress.     Breath sounds: Normal breath sounds.  Chest:     Chest wall: No tenderness.  Abdominal:     General: There is no distension.     Palpations: Abdomen is soft. There is no mass.     Tenderness: There is no abdominal tenderness. There is no guarding.  Musculoskeletal:        General: Normal range of motion.     Cervical back: Normal range of motion and neck supple.  Skin:    General: Skin is warm and dry.     Capillary Refill: Capillary refill takes less than 2 seconds.  Neurological:     General: No focal deficit present.     Mental Status: He is alert. Mental status is at baseline.     Cranial Nerves: No cranial nerve deficit.     Sensory: No sensory deficit.     Motor: No weakness.     ED Results / Procedures / Treatments   Labs (all labs ordered are listed, but only abnormal results are displayed) Labs Reviewed  HEPATIC FUNCTION PANEL - Abnormal; Notable for the following components:      Result Value   Indirect Bilirubin 1.1 (*)    All other components within normal limits  BASIC METABOLIC PANEL  CBC  PROTIME-INR  LIPASE, BLOOD  TROPONIN I (HIGH SENSITIVITY)  TROPONIN I (HIGH SENSITIVITY)    EKG EKG Interpretation  Date/Time:  Wednesday October 02 2019 02:49:35 EDT Ventricular Rate:  77 PR Interval:  150 QRS Duration: 86 QT Interval:  348 QTC Calculation: 393 R Axis:   80 Text Interpretation: Normal sinus rhythm with sinus arrhythmia Early repolarization Normal ECG When compared with ECG of 09/20/2019, No significant change was found Confirmed by 11/20/2019 (Dione Booze) on 10/02/2019 2:58:47 AM   Radiology DG Chest 2 View  Result Date: 10/02/2019 CLINICAL DATA:  Chest pain EXAM: CHEST - 2 VIEW COMPARISON:  None. FINDINGS: The heart size and mediastinal contours are within  normal limits. Both lungs are clear. The visualized skeletal structures are unremarkable. IMPRESSION: No active cardiopulmonary disease. Electronically Signed   By: 10/04/2019 M.D.   On: 10/02/2019 03:16    Procedures Procedures (including critical care time)  Medications Ordered in ED Medications  sodium chloride flush (NS) 0.9 % injection 3 mL (has no administration in time range)    ED Course  I have reviewed the triage vital signs and the nursing notes.  Pertinent labs & imaging results that were available during  my care of the patient were reviewed by me and considered in my medical decision making (see chart for details).    MDM Rules/Calculators/A&P                          Patient from a cardiac standpoint without evidence of any acute cardiac event.  Troponins x2 - chest x-ray negative EKG without acute changes.  The patient really points to the epigastric area so liver function tests were added lipase were added they were all normal other than a slight elevation of his bilirubin at 1.1.  CT scan was ordered to evaluate for the abdominal pain.  That is still pending.  Patient nontoxic no acute distress here.  CT scan of the abdomen without any acute findings.  No evidence of any pancreatitis.  Lipase also was normal.  Possibly could be related to peptic ulcer disease.  Will treat patient with Pepcid given referral to gastroenterology.  Patient stable for discharge home.  Final Clinical Impression(s) / ED Diagnoses Final diagnoses:  Epigastric abdominal pain    Rx / DC Orders ED Discharge Orders    None       Fredia Sorrow, MD 10/02/19 1153

## 2019-10-02 NOTE — ED Notes (Signed)
Pt d/c home per MD order. Discharge summary reviewed with pt, pt verbalizes understanding. Ambulatory of unit. No s/s of acute distress noted.

## 2019-10-02 NOTE — ED Notes (Signed)
Pt transported to CT ?

## 2019-10-02 NOTE — Discharge Instructions (Addendum)
Work-up for the chest pain as well as epigastric abdominal pain without any acute findings.  Except for symptoms could be related to a stomach ulcer.  Take the Pepcid medication as directed.  Follow-up with GI in case symptoms are secondary to an ulcer.  Referral information provided above.

## 2019-11-19 ENCOUNTER — Encounter (HOSPITAL_COMMUNITY): Payer: Self-pay | Admitting: Emergency Medicine

## 2019-11-19 ENCOUNTER — Emergency Department (HOSPITAL_COMMUNITY)
Admission: EM | Admit: 2019-11-19 | Discharge: 2019-11-20 | Disposition: A | Discharge status: Home / Self Care | Payer: Medicaid Other | Attending: Emergency Medicine | Admitting: Emergency Medicine

## 2019-11-19 DIAGNOSIS — Z5321 Procedure and treatment not carried out due to patient leaving prior to being seen by health care provider: Secondary | ICD-10-CM | POA: Diagnosis not present

## 2019-11-19 DIAGNOSIS — T730XXA Starvation, initial encounter: Secondary | ICD-10-CM | POA: Insufficient documentation
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
## 2019-11-19 NOTE — ED Triage Notes (Signed)
[place]. He is hungry.

## 2019-11-28 ENCOUNTER — Other Ambulatory Visit: Payer: Self-pay | Admitting: *Deleted

## 2019-11-28 ENCOUNTER — Encounter: Payer: Self-pay | Admitting: *Deleted
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
## 2019-12-02 ENCOUNTER — Other Ambulatory Visit: Payer: Self-pay | Admitting: *Deleted

## 2019-12-02 ENCOUNTER — Encounter: Payer: Self-pay | Admitting: *Deleted
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
## 2019-12-05 ENCOUNTER — Other Ambulatory Visit: Payer: Self-pay | Admitting: *Deleted

## 2019-12-05 ENCOUNTER — Encounter: Payer: Self-pay | Admitting: *Deleted
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
## 2019-12-13 ENCOUNTER — Emergency Department (HOSPITAL_COMMUNITY): Payer: Medicaid Other

## 2019-12-13 ENCOUNTER — Emergency Department (HOSPITAL_COMMUNITY)
Admission: EM | Admit: 2019-12-13 | Discharge: 2019-12-14 | Disposition: A | Discharge status: Home / Self Care | Payer: Medicaid Other | Attending: Emergency Medicine | Admitting: Emergency Medicine

## 2019-12-13 ENCOUNTER — Other Ambulatory Visit: Payer: Self-pay | Admitting: *Deleted

## 2019-12-13 ENCOUNTER — Encounter (HOSPITAL_COMMUNITY): Payer: Self-pay | Admitting: Emergency Medicine

## 2019-12-13 DIAGNOSIS — F840 Autistic disorder: Secondary | ICD-10-CM | POA: Insufficient documentation

## 2019-12-13 DIAGNOSIS — F17200 Nicotine dependence, unspecified, uncomplicated: Secondary | ICD-10-CM | POA: Diagnosis not present

## 2019-12-13 DIAGNOSIS — U071 COVID-19: Secondary | ICD-10-CM | POA: Diagnosis not present

## 2019-12-13 DIAGNOSIS — J029 Acute pharyngitis, unspecified: Secondary | ICD-10-CM | POA: Diagnosis present

## 2019-12-13 LAB — CBC
HCT: 43.1 % (ref 39.0–52.0)
Hemoglobin: 14.7 g/dL (ref 13.0–17.0)
MCH: 30.6 pg (ref 26.0–34.0)
MCHC: 34.1 g/dL (ref 30.0–36.0)
MCV: 89.8 fL (ref 80.0–100.0)
Platelets: 150 10*3/uL (ref 150–400)
RBC: 4.8 MIL/uL (ref 4.22–5.81)
RDW: 12.1 % (ref 11.5–15.5)
WBC: 5.5 10*3/uL (ref 4.0–10.5)
nRBC: 0 % (ref 0.0–0.2)

## 2019-12-13 LAB — URINALYSIS, ROUTINE W REFLEX MICROSCOPIC
Bacteria, UA: NONE SEEN
Bilirubin Urine: NEGATIVE
Glucose, UA: NEGATIVE mg/dL
Hgb urine dipstick: NEGATIVE
Ketones, ur: 5 mg/dL — AB
Leukocytes,Ua: NEGATIVE
Nitrite: NEGATIVE
Protein, ur: NEGATIVE mg/dL
Specific Gravity, Urine: 1.018 (ref 1.005–1.030)
pH: 5 (ref 5.0–8.0)

## 2019-12-13 LAB — COMPREHENSIVE METABOLIC PANEL
ALT: 19 U/L (ref 0–44)
AST: 22 U/L (ref 15–41)
Albumin: 4.3 g/dL (ref 3.5–5.0)
Alkaline Phosphatase: 67 U/L (ref 38–126)
Anion gap: 10 (ref 5–15)
BUN: 8 mg/dL (ref 6–20)
CO2: 25 mmol/L (ref 22–32)
Calcium: 9.4 mg/dL (ref 8.9–10.3)
Chloride: 104 mmol/L (ref 98–111)
Creatinine, Ser: 1.03 mg/dL (ref 0.61–1.24)
GFR calc Af Amer: >60 mL/min (ref >60)
GFR calc non Af Amer: >60 mL/min (ref >60)
Glucose, Bld: 90 mg/dL (ref 70–99)
Potassium: 3.6 mmol/L (ref 3.5–5.1)
Sodium: 139 mmol/L (ref 135–145)
Total Bilirubin: 0.7 mg/dL (ref 0.3–1.2)
Total Protein: 7.1 g/dL (ref 6.5–8.1)

## 2019-12-13 LAB — LIPASE, BLOOD: Lipase: 21 U/L (ref 11–51)

## 2019-12-13 MED ORDER — ACETAMINOPHEN 325 MG PO TABS
650.0000 mg | ORAL_TABLET | Freq: Once | ORAL | Status: AC
  Administered 2019-12-14: 650 mg via ORAL
  Filled 2019-12-13: qty 2
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
## 2019-12-13 NOTE — ED Notes (Signed)
PT states he's having sharp pain right upper abdominal , States may be an ulcer. PT has stepped outside.
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
## 2019-12-14 LAB — SARS CORONAVIRUS 2 BY RT PCR (HOSPITAL ORDER, PERFORMED IN ~~LOC~~ HOSPITAL LAB): SARS Coronavirus 2: POSITIVE — AB

## 2019-12-14 MED ORDER — ALBUTEROL SULFATE HFA 108 (90 BASE) MCG/ACT IN AERS: 1.0000 | INHALATION_SPRAY | Freq: Four times a day (QID) | RESPIRATORY_TRACT | 0 refills | Status: AC | PRN

## 2019-12-14 MED ORDER — DEXAMETHASONE SODIUM PHOSPHATE 10 MG/ML IJ SOLN
10.0000 mg | Freq: Once | INTRAMUSCULAR | Status: AC
  Administered 2019-12-14: 10 mg via INTRAMUSCULAR
  Filled 2019-12-14: qty 1

## 2019-12-14 NOTE — ED Provider Notes (Signed)
[name] EMERGENCY DEPARTMENT Provider Note   CSN: [id number] Arrival date & time: 12/13/19  1901     History Chief Complaint  Patient presents with  . Sore Throat    [name] is a 24 y.o. male.  HPI   Patient is a 24 year old male with medical history as noted below.  Patient states about 2 days ago he began experiencing a multitude of symptoms including fevers, chills, fatigue, weakness, headache, loss of taste/smell, cough, sore throat, shortness of breath.  He additionally reports some mild upper abdominal pain that he states is a chronic issue and secondary to a known gastric ulcer.  He denies nausea, vomiting, diarrhea, chest pain, syncope.    Past Medical History:  Diagnosis Date  . Autism   . Bipolar 1 disorder (HCC)   . Seizures (HCC)    none since 24 years old    Patient Active Problem List   Diagnosis Date Noted  . 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 TOOTH EXTRACTION         No family history on file.  Social History   Tobacco Use  . Smoking status: Current Some Day Smoker    Packs/day: 4.00    Types: Cigarettes  . Smokeless tobacco: Never Used  . Tobacco comment: 4 packs a day  Vaping Use  . Vaping Use: Former  Substance Use Topics  . Alcohol use: No  . Drug use: No    Comment: "BD"- stated he took one time     Home Medications Prior to Admission medications   Medication Sig Start Date End Date Taking? Authorizing Provider  benztropine (COGENTIN) 1 MG tablet Take 1 mg by mouth at bedtime. 07/09/19   [provider]  diphenhydrAMINE (BENADRYL) 25 MG tablet Take 1 tablet (25 mg total) by mouth every 6 (six) hours as needed. 07/31/19   Margarita Grizzle, MD  doxycycline (VIBRAMYCIN) 100 MG capsule Take 1 capsule (100 mg total) by mouth 2 (two) times daily. 07/31/19   Margarita Grizzle, MD  famotidine (PEPCID) 20 MG  tablet Take 1 tablet (20 mg total) by mouth 2 (two) times daily. 10/02/19   Vanetta Mulders, MD  guanFACINE (INTUNIV) 1 MG TB24 ER tablet Take 1 mg by mouth at bedtime. 07/09/19   [provider]  guanFACINE (TENEX) 1 MG tablet Take 1 mg by mouth every morning. 07/09/19   [provider]  naproxen (NAPROSYN) 500 MG tablet Take 1 tablet (500 mg total) by mouth 2 (two) times daily. 07/25/19   Derwood Kaplan, MD  OLANZapine (ZYPREXA) 15 MG tablet Take 15 mg by mouth at bedtime. 07/09/19   [provider]  predniSONE (DELTASONE) 10 MG tablet Take 2 tablets (20 mg total) by mouth 2 (two) times daily with a meal. 07/31/19   Margarita Grizzle, MD  traZODone (DESYREL) 50 MG tablet Take 50-100 mg by mouth at bedtime as needed for sleep.  07/09/19   [provider]    Allergies    Daytrana [methylphenidate]  Review of Systems   Review of Systems  All other systems reviewed and are negative. Ten systems reviewed and are negative for acute change, except as noted in the HPI.    Physical Exam Updated Vital Signs BP 126/79 (BP Location: Left Arm)   Pulse 80   Temp (!) 100.9 F (38.3 C) (Oral)   Resp 12   Ht 5'  9" (1.753 m)   SpO2 100%   BMI 22.14 kg/m   Physical Exam Vitals and nursing note reviewed.  Constitutional:      General: He is not in acute distress.    Appearance: Normal appearance. He is well-developed and normal weight. He is not ill-appearing, toxic-appearing or diaphoretic.  HENT:     Head: Normocephalic and atraumatic.     Right Ear: External ear normal.     Left Ear: External ear normal.     Nose: Nose normal. No congestion.     Mouth/Throat:     Mouth: Mucous membranes are moist. No oral lesions.     Pharynx: Oropharynx is clear. Uvula midline. Posterior oropharyngeal erythema present. No pharyngeal swelling, oropharyngeal exudate or uvula swelling.     Tonsils: No tonsillar exudate or tonsillar abscesses. 1+ on the right. 1+ on the left.      Comments: Mild erythema without exudates noted in the posterior oropharynx.  1+ bilateral and symmetrical tonsillar hypertrophy.  Patient is readily handling secretions.  No stridor.  He is speaking in complete sentences. Eyes:     Extraocular Movements: Extraocular movements intact.  Cardiovascular:     Rate and Rhythm: Normal rate and regular rhythm.     Pulses: Normal pulses.     Heart sounds: Normal heart sounds. No murmur heard.  No friction rub. No gallop.   Pulmonary:     Effort: Pulmonary effort is normal. No respiratory distress.     Breath sounds: Normal breath sounds. No stridor. No wheezing, rhonchi or rales.     Comments: Lungs are clear to auscultation bilaterally.  No chest tenderness. Chest:     Chest wall: No tenderness.  Abdominal:     General: Abdomen is flat.     Palpations: Abdomen is soft.     Tenderness: There is no abdominal tenderness.  Musculoskeletal:        General: Normal range of motion.     Cervical back: Normal range of motion and neck supple. No tenderness.  Skin:    General: Skin is warm and dry.  Neurological:     General: No focal deficit present.     Mental Status: He is alert and oriented to person, place, and time.  Psychiatric:        Mood and Affect: Mood normal.        Behavior: Behavior normal.    ED Results / Procedures / Treatments   Labs (all labs ordered are listed, but only abnormal results are displayed) Labs Reviewed  SARS CORONAVIRUS 2 BY RT PCR (HOSPITAL ORDER, PERFORMED IN [facility]) - Abnormal; Notable for the following components:      Result Value   SARS Coronavirus 2 POSITIVE (*)    All other components within normal limits  URINALYSIS, ROUTINE W REFLEX MICROSCOPIC - Abnormal; Notable for the following components:   Ketones, ur 5 (*)    All other components within normal limits  LIPASE, BLOOD  COMPREHENSIVE METABOLIC PANEL  CBC    EKG None  Radiology DG Chest Portable 1 View  Result Date:  12/13/2019 CLINICAL DATA:  Cough.  Chest pain.  Shortness of breath.  Smoker. EXAM: PORTABLE CHEST 1 VIEW COMPARISON:  Most recent radiograph 10/02/2019 FINDINGS: The cardiomediastinal contours are normal. The lungs are clear. Pulmonary vasculature is normal. No consolidation, pleural effusion, or pneumothorax. No acute osseous abnormalities are seen. IMPRESSION: Negative frontal upright view of the chest. Electronically Signed   By: Narda Rutherford  M.D.   On: 12/13/2019 21:00    Procedures Procedures (including critical care time)  Medications Ordered in ED Medications  acetaminophen (TYLENOL) tablet 650 mg (650 mg Oral Given 12/14/19 1042)  dexamethasone (DECADRON) injection 10 mg (10 mg Intramuscular Given 12/14/19 1043)    ED Course  I have reviewed the triage vital signs and the nursing notes.  Pertinent labs & imaging results that were available during my care of the patient were reviewed by me and considered in my medical decision making (see chart for details).  Clinical Course as of Dec 13 1005  Sat Dec 14, 2019  0958 SARS Coronavirus 2(!): POSITIVE [LJ]    Clinical Course User Index [LJ] Placido Sou, PA-C   MDM Rules/Calculators/A&P                          Pt is a 24 y.o. male that presents with a history, physical exam, and ED Clinical Course as noted above.   Patient presents today with a multitude of symptoms consistent with COVID-19.  Patient has not been vaccinated for COVID-19.  He was found to be positive for COVID-19 at this visit.  Basic labs were obtained in triage including CBC, CMP, lipase, UA.  These are all reassuring.  Physical exam was also reassuring.  He does have some mild erythema in the posterior oropharynx as well as some mild epigastric tenderness.  He states that his epigastric tenderness is not new and is secondary to a stomach ulcer.  No leukocytosis.  No elevation in his lipase.  Hemoglobin is 14.7.  His lungs are clear to auscultation  bilaterally.  He was mildly febrile at 100.9 Fahrenheit.  He was given Tylenol as well as Decadron here in the emergency department.  He was offered a GI cocktail but declined and states that "I'll deal with that".  He is not hypoxic.  Not tachycardic.  Normotensive.  We will discharge the patient with a prescription for an albuterol inhaler.  Discussed its usage.  Tylenol for fevers.  No NSAIDs given his history of stomach ulcer.  He understands return to the ED with new or worsening symptoms.  We discussed quarantining for 10 to 14 days from symptom onset due to his diagnosis.  His questions were answered and he was amicable at the time of discharge.  His vital signs are stable.    An After Visit Summary was printed and given to the patient.  Patient discharged to home/self care.  Condition at discharge: Stable  Note: Portions of this report may have been transcribed using voice recognition software. Every effort was made to ensure accuracy; however, inadvertent computerized transcription errors may be present.   Final Clinical Impression(s) / ED Diagnoses Final diagnoses:  COVID-19  Pharyngitis, unspecified etiology    Rx / DC Orders ED Discharge Orders         Ordered    albuterol (VENTOLIN HFA) 108 (90 Base) MCG/ACT inhaler  Every 6 hours PRN        12/14/19 1142           Placido Sou, PA-C 12/14/19 1144    Tilden Fossa, MD 12/14/19 1206

## 2019-12-14 NOTE — Discharge Instructions (Addendum)
I prescribed you an albuterol inhaler you can use as needed for cough/shortness of breath.  Please continue to take Tylenol for management of your fevers and body aches.  Please make sure you quarantine for 10 to 14 days from symptom onset.  It is important that you do not expose others to COVID-19.  If you develop new or worsening symptoms you can always return to the emergency department for reevaluation.  It was a pleasure to meet you.

## 2019-12-15 ENCOUNTER — Other Ambulatory Visit: Payer: Self-pay | Admitting: Adult Health

## 2019-12-15 DIAGNOSIS — U071 COVID-19: Secondary | ICD-10-CM

## 2019-12-15 NOTE — Progress Notes (Signed)
I connected by phone with [name] on 12/15/2019 at 10:13 AM to discuss the potential use of a new treatment for mild to moderate COVID-19 viral infection in non-hospitalized patients.  This patient is a 24 y.o. male that meets the FDA criteria for Emergency Use Authorization of COVID monoclonal antibody casirivimab/imdevimab.  Has a (+) direct SARS-CoV-2 viral test result  Has mild or moderate COVID-19   Is NOT hospitalized due to COVID-19  Is within 10 days of symptom onset  Has at least one of the high risk factor(s) for progression to severe COVID-19 and/or hospitalization as defined in EUA.  Specific high risk criteria : Other high risk medical condition per CDC:  not vaccinated, socially vulnerable   I have spoken and communicated the following to the patient or parent/caregiver regarding COVID monoclonal antibody treatment:  1. FDA has authorized the emergency use for the treatment of mild to moderate COVID-19 in adults and pediatric patients with positive results of direct SARS-CoV-2 viral testing who are 90 years of age and older weighing at least 40 kg, and who are at high risk for progressing to severe COVID-19 and/or hospitalization.  2. The significant known and potential risks and benefits of COVID monoclonal antibody, and the extent to which such potential risks and benefits are unknown.  3. Information on available alternative treatments and the risks and benefits of those alternatives, including clinical trials.  4. Patients treated with COVID monoclonal antibody should continue to self-isolate and use infection control measures (e.g., wear mask, isolate, social distance, avoid sharing personal items, clean and disinfect "high touch" surfaces, and frequent handwashing) according to CDC guidelines.   5. The patient or parent/caregiver has the option to accept or refuse COVID monoclonal antibody treatment.  After reviewing this information with the patient, The  patient agreed to proceed with receiving casirivimab\imdevimab infusion and will be provided a copy of the Fact sheet prior to receiving the infusion. Noreene Filbert 12/15/2019 10:13 AM

## 2019-12-16 ENCOUNTER — Ambulatory Visit (HOSPITAL_COMMUNITY): Payer: Medicaid Other | Attending: Pulmonary Disease

## 2019-12-16 ENCOUNTER — Emergency Department (HOSPITAL_COMMUNITY)
Admission: EM | Admit: 2019-12-16 | Discharge: 2019-12-16 | Disposition: A | Discharge status: Home / Self Care | Payer: Medicaid Other | Attending: Emergency Medicine | Admitting: Emergency Medicine

## 2019-12-16 ENCOUNTER — Other Ambulatory Visit: Payer: Self-pay

## 2019-12-16 DIAGNOSIS — U071 COVID-19: Secondary | ICD-10-CM | POA: Insufficient documentation

## 2019-12-16 DIAGNOSIS — F17210 Nicotine dependence, cigarettes, uncomplicated: Secondary | ICD-10-CM | POA: Insufficient documentation

## 2019-12-16 DIAGNOSIS — Z79899 Other long term (current) drug therapy: Secondary | ICD-10-CM | POA: Diagnosis not present

## 2019-12-16 DIAGNOSIS — R0602 Shortness of breath: Secondary | ICD-10-CM | POA: Diagnosis present

## 2019-12-16 NOTE — ED Triage Notes (Signed)
Per EMS- patient  reported that he tested + for Covid on 12/13/19. Patient called EMS from [facility] today and stated that he was vomiting blood, but when they arrived he did not have those symptoms. Patient is homeless. EMS states the patient was on the phone the entire time.

## 2019-12-16 NOTE — ED Provider Notes (Signed)
[facility]-EMERGENCY DEPT Provider Note   CSN: [id number] Arrival date & time: 12/16/19  1653     History Chief Complaint  Patient presents with  . covid positive    [name] is a 24 y.o. male with PMHx bipolar disorder and autism who presents to the ED today via EMS. Pt reports he was told to come to the [facility] ED for MAB infusion today as he tested positive 3 days ago. He states that he was told his appointment was at 4 PM and he needed to come to the ED to register. Per EMS pt called from [employer] today stating that he was vomiting blood however when they arrived he did not have those symptoms. EMS reports that pt was on the phone the entire time in NAD. Pt continues to repeat that he is here for "the treatment" and won't answer any other questions.   The history is provided by the patient, medical records and the EMS personnel.       Past Medical History:  Diagnosis Date  . Autism   . Bipolar 1 disorder (HCC)   . Seizures (HCC)    none since 24 years old    Patient Active Problem List   Diagnosis Date Noted  . 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 TOOTH EXTRACTION         No family history on file.  Social History   Tobacco Use  . Smoking status: Current Some Day Smoker    Packs/day: 4.00    Types: Cigarettes  . Smokeless tobacco: Never Used  . Tobacco comment: 4 packs a day  Vaping Use  . Vaping Use: Former  Substance Use Topics  . Alcohol use: No  . Drug use: No    Comment: "BD"- stated he took one time     Home Medications Prior to Admission medications   Medication Sig Start Date End Date Taking? Authorizing Provider  albuterol (VENTOLIN HFA) 108 (90 Base) MCG/ACT inhaler Inhale 1-2 puffs into the lungs every 6 (six) hours as needed for wheezing or shortness of breath. 12/14/19   Placido Sou, PA-C   benztropine (COGENTIN) 1 MG tablet Take 1 mg by mouth at bedtime. 07/09/19   [provider]  diphenhydrAMINE (BENADRYL) 25 MG tablet Take 1 tablet (25 mg total) by mouth every 6 (six) hours as needed. 07/31/19   Margarita Grizzle, MD  doxycycline (VIBRAMYCIN) 100 MG capsule Take 1 capsule (100 mg total) by mouth 2 (two) times daily. 07/31/19   Margarita Grizzle, MD  famotidine (PEPCID) 20 MG tablet Take 1 tablet (20 mg total) by mouth 2 (two) times daily. 10/02/19   Vanetta Mulders, MD  guanFACINE (INTUNIV) 1 MG TB24 ER tablet Take 1 mg by mouth at bedtime. 07/09/19   [provider]  guanFACINE (TENEX) 1 MG tablet Take 1 mg by mouth every morning. 07/09/19   [provider]  naproxen (NAPROSYN) 500 MG tablet Take 1 tablet (500 mg total) by mouth 2 (two) times daily. 07/25/19   Derwood Kaplan, MD  OLANZapine (ZYPREXA) 15 MG tablet Take 15 mg by mouth at bedtime. 07/09/19   [provider]  predniSONE (DELTASONE) 10 MG tablet Take 2 tablets (20 mg total) by mouth 2 (two) times daily with a meal. 07/31/19   Margarita Grizzle, MD  traZODone (DESYREL) 50 MG tablet Take 50-100 mg by mouth at bedtime  as needed for sleep.  07/09/19   [provider]    Allergies    Daytrana [methylphenidate]  Review of Systems   Review of Systems  Constitutional: Positive for chills and fatigue.  Respiratory: Positive for cough and shortness of breath.     Physical Exam Updated Vital Signs BP 124/73 (BP Location: Left Arm)   Pulse 72   Temp 99 F (37.2 C) (Oral)   Resp 18   Ht 5\' 9"  (1.753 m)   Wt 68 kg   SpO2 98%   BMI 22.15 kg/m   Physical Exam Vitals and nursing note reviewed.  Constitutional:      Appearance: He is not ill-appearing.  HENT:     Head: Normocephalic and atraumatic.  Eyes:     Conjunctiva/sclera: Conjunctivae normal.  Cardiovascular:     Rate and Rhythm: Normal rate and regular rhythm.     Pulses: Normal pulses.  Pulmonary:     Effort: Pulmonary  effort is normal.     Breath sounds: Normal breath sounds. No wheezing, rhonchi or rales.     Comments: Speaking in full sentences without difficulty. Satting 98% on RA.  Abdominal:     Palpations: Abdomen is soft.     Tenderness: There is no abdominal tenderness.  Musculoskeletal:     Cervical back: Neck supple.  Skin:    General: Skin is warm and dry.  Neurological:     Mental Status: He is alert.     ED Results / Procedures / Treatments   Labs (all labs ordered are listed, but only abnormal results are displayed) Labs Reviewed - No data to display  EKG None  Radiology No results found.  Procedures Procedures (including critical care time)  Medications Ordered in ED Medications - No data to display  ED Course  I have reviewed the triage vital signs and the nursing notes.  Pertinent labs & imaging results that were available during my care of the patient were reviewed by me and considered in my medical decision making (see chart for details).    MDM Rules/Calculators/A&P                          24 year old male who recently tested positive for COVID-19 3 days ago presenting to the ED today stating he was told to come here for infusion.  She reports he received a call yesterday and said that his appointment was at 4 PM today and he needs to come to the [facility] emergency department to check in.  There is a telephone encounter yesterday by heme-onc regarding Mab infusion.  I have personally reached out to the Mab infusion clinic and spoken with [facility], NP reports that she attempted to give patient the address however he promptly told her that he knew where to go and did not want additional information.  They have kindly rescheduled this patient for 4 PM tomorrow.  I will have him the address and phone number.  He is instructed to stand outside and call to be escorted in.  Patient appears stable today.  He is afebrile, nontachycardic and nontachypneic.  He is able  to speak in full sentences without difficulty and satting 98% on room air.  I do not feel he needs additional testing or imaging at this time as he slowly states he is here for his infusion.  Patient discharged home.   This note was prepared using [employer] and may  include unintentional dictation errors due to the inherent limitations of voice recognition software.  [name] was evaluated in Emergency Department on 12/16/2019 for the symptoms described in the history of present illness. He was evaluated in the context of the global COVID-19 pandemic, which necessitated consideration that the patient might be at risk for infection with the SARS-CoV-2 virus that causes COVID-19. Institutional protocols and algorithms that pertain to the evaluation of patients at risk for COVID-19 are in a state of rapid change based on information released by regulatory bodies including the CDC and federal and state organizations. These policies and algorithms were followed during the patient's care in the ED.  Final Clinical Impression(s) / ED Diagnoses Final diagnoses:  COVID-19    Rx / DC Orders ED Discharge Orders    None       Discharge Instructions     You have missed your appointment today at the Infusion Center at 4 PM. They have rescheduled you for tomorrow at 4 PM.   Please go to:  [address], [place], [place] Please stand outside in the parking lot and call [phone] and wait for someone to come outside and escort you in.        Tanda Rockers, PA-C 12/16/19 1748    Sabas Sous, MD 12/16/19 [phone]

## 2019-12-16 NOTE — ED Triage Notes (Signed)
Patient states that he is here to get an infusion for Covid.

## 2019-12-16 NOTE — Discharge Instructions (Addendum)
You have missed your appointment today at the Infusion Center at 4 PM. They have rescheduled you for tomorrow at 4 PM.   Please go to:  [address], [place], [place] Please stand outside in the parking lot and call [phone] and wait for someone to come outside and escort you in.

## 2019-12-17 ENCOUNTER — Ambulatory Visit (HOSPITAL_COMMUNITY)
Admission: RE | Admit: 2019-12-17 | Discharge: 2019-12-17 | Disposition: A | Discharge status: Home / Self Care | Payer: Medicaid Other | Source: Ambulatory Visit | Attending: Pulmonary Disease | Admitting: Pulmonary Disease

## 2019-12-17 DIAGNOSIS — U071 COVID-19: Secondary | ICD-10-CM | POA: Insufficient documentation

## 2019-12-17 MED ORDER — EPINEPHRINE 0.3 MG/0.3ML IJ SOAJ: 0.3000 mg | Freq: Once | INTRAMUSCULAR | Status: DC | PRN

## 2019-12-17 MED ORDER — SODIUM CHLORIDE 0.9 % IV SOLN: INTRAVENOUS | Status: DC | PRN

## 2019-12-17 MED ORDER — METHYLPREDNISOLONE SODIUM SUCC 125 MG IJ SOLR: 125.0000 mg | Freq: Once | INTRAMUSCULAR | Status: DC | PRN

## 2019-12-17 MED ORDER — FAMOTIDINE IN NACL 20-0.9 MG/50ML-% IV SOLN: 20.0000 mg | Freq: Once | INTRAVENOUS | Status: DC | PRN

## 2019-12-17 MED ORDER — SODIUM CHLORIDE 0.9 % IV SOLN
1200.0000 mg | Freq: Once | INTRAVENOUS | Status: AC
  Administered 2019-12-17: 1200 mg via INTRAVENOUS

## 2019-12-17 MED ORDER — DIPHENHYDRAMINE HCL 50 MG/ML IJ SOLN: 50.0000 mg | Freq: Once | INTRAMUSCULAR | Status: DC | PRN

## 2019-12-17 MED ORDER — ALBUTEROL SULFATE HFA 108 (90 BASE) MCG/ACT IN AERS: 2.0000 | INHALATION_SPRAY | Freq: Once | RESPIRATORY_TRACT | Status: DC | PRN

## 2019-12-17 NOTE — Discharge Instructions (Signed)

## 2019-12-17 NOTE — Progress Notes (Signed)
  Diagnosis: COVID-19  Physician: Dr Patrick Wright  Procedure: Covid Infusion Clinic Med: casirivimab\imdevimab infusion - Provided patient with casirivimab\imdevimab fact sheet for patients, parents and caregivers prior to infusion.  Complications: No immediate complications noted.  Discharge: Discharged home   Cesilia Shinn L 12/17/2019   

## 2019-12-20 ENCOUNTER — Other Ambulatory Visit: Payer: Self-pay

## 2019-12-20 ENCOUNTER — Emergency Department (HOSPITAL_COMMUNITY)
Admission: EM | Admit: 2019-12-20 | Discharge: 2019-12-21 | Disposition: A | Discharge status: Home / Self Care | Payer: Medicaid Other | Attending: Emergency Medicine | Admitting: Emergency Medicine

## 2019-12-20 DIAGNOSIS — F840 Autistic disorder: Secondary | ICD-10-CM | POA: Insufficient documentation

## 2019-12-20 DIAGNOSIS — L02414 Cutaneous abscess of left upper limb: Secondary | ICD-10-CM | POA: Insufficient documentation

## 2019-12-20 DIAGNOSIS — Z5321 Procedure and treatment not carried out due to patient leaving prior to being seen by health care provider: Secondary | ICD-10-CM | POA: Insufficient documentation

## 2019-12-20 DIAGNOSIS — R531 Weakness: Secondary | ICD-10-CM | POA: Diagnosis not present

## 2019-12-20 DIAGNOSIS — U071 COVID-19: Secondary | ICD-10-CM | POA: Diagnosis not present

## 2019-12-20 DIAGNOSIS — R600 Localized edema: Secondary | ICD-10-CM | POA: Diagnosis not present

## 2019-12-20 DIAGNOSIS — F17210 Nicotine dependence, cigarettes, uncomplicated: Secondary | ICD-10-CM | POA: Diagnosis not present

## 2019-12-20 NOTE — ED Notes (Signed)
Pt is upset due to wait time, pt states he would "go home and cut it open himself" this tech advised him to let a medical professional determine what needs to be done, however pt refused to stay.

## 2019-12-20 NOTE — ED Triage Notes (Signed)
Pt reports abscess to L forearm x 1 week. Denies drug use. Covid positive 7 days ago. Denies symptoms at this time.

## 2019-12-21 ENCOUNTER — Other Ambulatory Visit: Payer: Self-pay

## 2019-12-21 ENCOUNTER — Encounter (HOSPITAL_COMMUNITY): Payer: Self-pay

## 2019-12-21 ENCOUNTER — Emergency Department (HOSPITAL_COMMUNITY)
Admission: EM | Admit: 2019-12-21 | Discharge: 2019-12-21 | Disposition: A | Discharge status: Home / Self Care | Payer: Medicaid Other | Source: Home / Self Care | Attending: Emergency Medicine | Admitting: Emergency Medicine

## 2019-12-21 DIAGNOSIS — F840 Autistic disorder: Secondary | ICD-10-CM | POA: Insufficient documentation

## 2019-12-21 DIAGNOSIS — L0291 Cutaneous abscess, unspecified: Secondary | ICD-10-CM

## 2019-12-21 DIAGNOSIS — R600 Localized edema: Secondary | ICD-10-CM | POA: Insufficient documentation

## 2019-12-21 DIAGNOSIS — R531 Weakness: Secondary | ICD-10-CM | POA: Insufficient documentation

## 2019-12-21 DIAGNOSIS — U071 COVID-19: Secondary | ICD-10-CM | POA: Insufficient documentation

## 2019-12-21 DIAGNOSIS — L02414 Cutaneous abscess of left upper limb: Secondary | ICD-10-CM | POA: Insufficient documentation

## 2019-12-21 DIAGNOSIS — F17210 Nicotine dependence, cigarettes, uncomplicated: Secondary | ICD-10-CM | POA: Insufficient documentation

## 2019-12-21 MED ORDER — DOXYCYCLINE HYCLATE 100 MG PO TABS
100.0000 mg | ORAL_TABLET | Freq: Once | ORAL | Status: AC
  Administered 2019-12-21: 100 mg via ORAL
  Filled 2019-12-21: qty 1

## 2019-12-21 MED ORDER — IBUPROFEN 400 MG PO TABS
600.0000 mg | ORAL_TABLET | Freq: Once | ORAL | Status: AC
  Administered 2019-12-21: 600 mg via ORAL
  Filled 2019-12-21: qty 1

## 2019-12-21 MED ORDER — DOXYCYCLINE HYCLATE 100 MG PO CAPS: 100.0000 mg | ORAL_CAPSULE | Freq: Two times a day (BID) | ORAL | 0 refills | Status: AC

## 2019-12-21 MED ORDER — LIDOCAINE-EPINEPHRINE 1 %-1:100000 IJ SOLN
10.0000 mL | Freq: Once | INTRAMUSCULAR | Status: AC
  Administered 2019-12-21: 10 mL via INTRADERMAL
  Filled 2019-12-21: qty 1

## 2019-12-21 NOTE — ED Provider Notes (Signed)
[name] EMERGENCY DEPARTMENT Provider Note   CSN: [id number] Arrival date & time: 12/21/19  1329     History Chief Complaint  Patient presents with   Abscess    [name] is a 24 y.o. male asthma history of autism, bipolar, seizures who presents for evaluation of pain, swelling noted to his left upper extremity x1 week.  He states that since onset of symptoms, the pain, swelling has gotten worse.  He states that the area has been tender and that it has been red and hot to touch.  He has not noticed any drainage.  He tried to open it himself but was unable to get anything out.  He denies any IV drug use, fevers.  Triage note also mentions facial swelling.  I discussed this with patient.  He states he has had intermittent facial swelling over the last few days.  He states that it is mostly in his upper lip when this occurs.  He states that currently it is improved and he does not have any symptoms at this time.  He has not any difficulty breathing, shortness of breath, difficulty tolerating p.o.  The history is provided by the patient.       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  Social History   Tobacco Use   Smoking status: Current Some Day Smoker    Packs/day: 4.00    Types: Cigarettes   Smokeless tobacco: Never Used   Tobacco comment: 4 packs a day  Vaping Use   Vaping Use: Former  Substance Use Topics   Alcohol use: No   Drug use: No    Comment: "BD"- stated he took one time     Home Medications Prior to Admission medications   Medication Sig Start Date End Date Taking? Authorizing Provider   albuterol (VENTOLIN HFA) 108 (90 Base) MCG/ACT inhaler Inhale 1-2 puffs into the lungs every 6 (six) hours as needed for wheezing or shortness of breath. 12/14/19   Placido Sou, PA-C  benztropine (COGENTIN) 1 MG tablet Take 1 mg by mouth at bedtime. 07/09/19   [provider]  diphenhydrAMINE (BENADRYL) 25 MG tablet Take 1 tablet (25 mg total) by mouth every 6 (six) hours as needed. 07/31/19   Margarita Grizzle, MD  doxycycline (VIBRAMYCIN) 100 MG capsule Take 1 capsule (100 mg total) by mouth 2 (two) times daily for 7 days. 12/21/19 12/28/19  Maxwell Caul, PA-C  famotidine (PEPCID) 20 MG tablet Take 1 tablet (20 mg total) by mouth 2 (two) times daily. 10/02/19   Vanetta Mulders, MD  guanFACINE (INTUNIV) 1 MG TB24 ER tablet Take 1 mg by mouth at bedtime. 07/09/19   [provider]  guanFACINE (TENEX) 1 MG tablet Take 1 mg by mouth every morning. 07/09/19   [provider]  naproxen (NAPROSYN) 500 MG tablet Take 1 tablet (500 mg total) by mouth 2 (two) times daily. 07/25/19   Derwood Kaplan, MD  OLANZapine (ZYPREXA) 15 MG tablet Take 15 mg by mouth at bedtime. 07/09/19   [provider]  predniSONE (DELTASONE) 10 MG tablet Take 2 tablets (20 mg total) by mouth 2 (two) times daily with a meal. 07/31/19   Margarita Grizzle, MD  traZODone (DESYREL) 50 MG tablet Take 50-100 mg by mouth at bedtime as needed for sleep.  07/09/19   [provider]    Allergies    Daytrana [methylphenidate]  Review of Systems   Review of Systems  Constitutional: Negative for fever.  HENT: Negative for facial swelling.   Respiratory: Negative for shortness of breath.   Cardiovascular: Negative for chest pain.  Gastrointestinal: Negative for abdominal pain, nausea and vomiting.  Skin: Positive for color change and wound.  Neurological: Positive for weakness. Negative for numbness and headaches.  All other systems reviewed and are negative.   Physical Exam Updated Vital Signs BP  119/78    Pulse 89    Temp 98.2 F (36.8 C) (Oral)    Resp 16    SpO2 98%   Physical Exam Vitals and nursing note reviewed.  Constitutional:      Appearance: He is well-developed.  HENT:     Head: Normocephalic and atraumatic.     Mouth/Throat:     Comments: No oral angioedema.  Airways patent, phonation is intact.  Uvula is midline.  No dental caries.  Face is symmetric in appearance without any overlying warmth, erythema, edema. Eyes:     General: No scleral icterus.       Right eye: No discharge.        Left eye: No discharge.     Conjunctiva/sclera: Conjunctivae normal.  Cardiovascular:     Pulses:          Radial pulses are 2+ on the right side and 2+ on the left side.  Pulmonary:     Effort: Pulmonary effort is normal.  Skin:    General: Skin is warm and dry.          Comments: Quarter sized area (about 2.5 cm in diameter) of warmth, erythema with central fluctuance noted to the posterior lateral aspect of the mid left forearm.  There is no active drainage.  Neurological:     Mental Status: He is alert.  Psychiatric:        Speech: Speech normal.        Behavior: Behavior normal.     ED Results / Procedures / Treatments   Labs (all labs ordered are listed, but only abnormal results are displayed) Labs Reviewed - No data to display  EKG None  Radiology No results found.  Procedures .Marland KitchenIncision and Drainage  Date/Time: 12/21/2019 7:53 PM Performed by: Maxwell Caul, PA-C Authorized by: Maxwell Caul, PA-C   Consent:    Consent obtained:  Verbal   Consent given by:  Patient   Risks discussed:  Bleeding, incomplete drainage, pain and damage to other organs   Alternatives discussed:  No treatment Universal protocol:    Procedure explained and questions answered to patient or proxy's satisfaction: yes     Relevant documents present and verified: yes     Test results available and properly labeled: yes     Imaging studies available: yes     Required  blood products, implants, devices, and special equipment available: yes     Site/side marked: yes     Immediately prior to procedure a time out was called: yes     Patient identity confirmed:  Verbally with patient Location:    Type:  Abscess   Location:  Upper extremity  Upper extremity location:  Arm   Arm location:  L lower arm Pre-procedure details:    Skin preparation:  Betadine Anesthesia (see MAR for exact dosages):    Anesthesia method:  Local infiltration   Local anesthetic:  Lidocaine 2% WITH epi Procedure type:    Complexity:  Complex Procedure details:    Incision types:  Single straight   Incision depth:  Subcutaneous   Scalpel blade:  11   Wound management:  Probed and deloculated, irrigated with saline and extensive cleaning   Drainage:  Purulent   Drainage amount:  Moderate   Packing materials:  1/4 in gauze Post-procedure details:    Patient tolerance of procedure:  Tolerated well, no immediate complications   (including critical care time)  Medications Ordered in ED Medications  lidocaine-EPINEPHrine (XYLOCAINE W/EPI) 1 %-1:100000 (with pres) injection 10 mL (10 mLs Intradermal Given by Other 12/21/19 2129)  doxycycline (VIBRA-TABS) tablet 100 mg (100 mg Oral Given 12/21/19 2128)  ibuprofen (ADVIL) tablet 600 mg (600 mg Oral Given 12/21/19 2128)    ED Course  I have reviewed the triage vital signs and the nursing notes.  Pertinent labs & imaging results that were available during my care of the patient were reviewed by me and considered in my medical decision making (see chart for details).    MDM Rules/Calculators/A&P                          24 year old male who was recently tested positive for COVID-19 comes in today for pain, swelling to left upper extremity x1 week.  No fevers.  States the area has gotten more painful.  He also mentioned in triage note that he was having facial swelling.  When I discussed this with him he states that he has had  intermittent facial swelling over the last few days, mostly in the upper lip.  States that symptoms are improved.  Currently denies any issues, difficulty swallowing, difficulty tolerating p.o., difficulty breathing.  On exam, his face is symmetric in appearance.  I do not appreciate any overt swelling of his upper lip or lower lip, tongue. He said he had some swelling in his upper lip but it is better now.   Posterior oropharynx is clear.  He states that the only thing he is here for is his arm.  On initially arrival, he is afebrile, nontoxic-appearing.  Vital signs are stable.  He is neurovascularly intact.  He denies any history of IV drug use.  On exam, he has a area about a quarter size noted to the posterior lateral aspect of his left arm with surrounding warmth, erythema no active drainage.  Exam consistent with an abscess.  I&D performed as documented above.  Patient does not know if he got bit by anything. Will plan for doxycycline. He is allergic to Daytrana. At this time, patient exhibits no emergent life-threatening condition that require further evaluation in ED. Patient had ample opportunity for questions and discussion. All patient's questions were answered with full understanding. Strict return precautions discussed. Patient expresses understanding and agreement to plan.   Portions of this note were generated with Scientist, clinical (histocompatibility and immunogenetics)Dragon dictation software. Dictation errors may occur despite best attempts at proofreading.  Final Clinical Impression(s) / ED Diagnoses Final diagnoses:  Abscess    Rx / DC Orders ED Discharge Orders         Ordered    doxycycline (VIBRAMYCIN) 100 MG capsule  2 times daily  12/21/19 1956           Maxwell Caul, PA-C 12/21/19 2210    Virgina Norfolk, DO 12/21/19 2244

## 2019-12-21 NOTE — Discharge Instructions (Signed)
Apply warm compresses to the area or soak the area in warm water to help continue express drainage.   Keep the wound clean and dry. Gently wash the wound with soap and water and make sure to pat it dry.   Take antibiotic and complete the entire course.   You can take Tylenol or Ibuprofen as directed for pain.  Followup with the ER or Urgent Care  in 2 days for wound recheck and packing removal.   Return to the Emergency Department if you experienced any worsening/spreading redness or swelling, fever, worsening pain, or any other worsening or concerning symptoms.

## 2019-12-21 NOTE — ED Triage Notes (Signed)
Patient complains of abscess to left arm and swelling to right side of face since yesterday, also has swelling to upper lip, no tongue swelling. Patient here yesterday and left prior to being seen

## 2019-12-25 ENCOUNTER — Other Ambulatory Visit: Payer: Self-pay | Admitting: *Deleted

## 2019-12-25 ENCOUNTER — Encounter: Payer: Self-pay | Admitting: *Deleted
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
## 2020-01-15 IMAGING — DX DG KNEE COMPLETE 4+V*R*
4 series · 4 of 4 positions shown · non-contrast
Comparison: 10/06/2017

CLINICAL DATA: Knee injury with pain

EXAM:
RIGHT KNEE - COMPLETE 4+ VIEW

[t knee ap right]
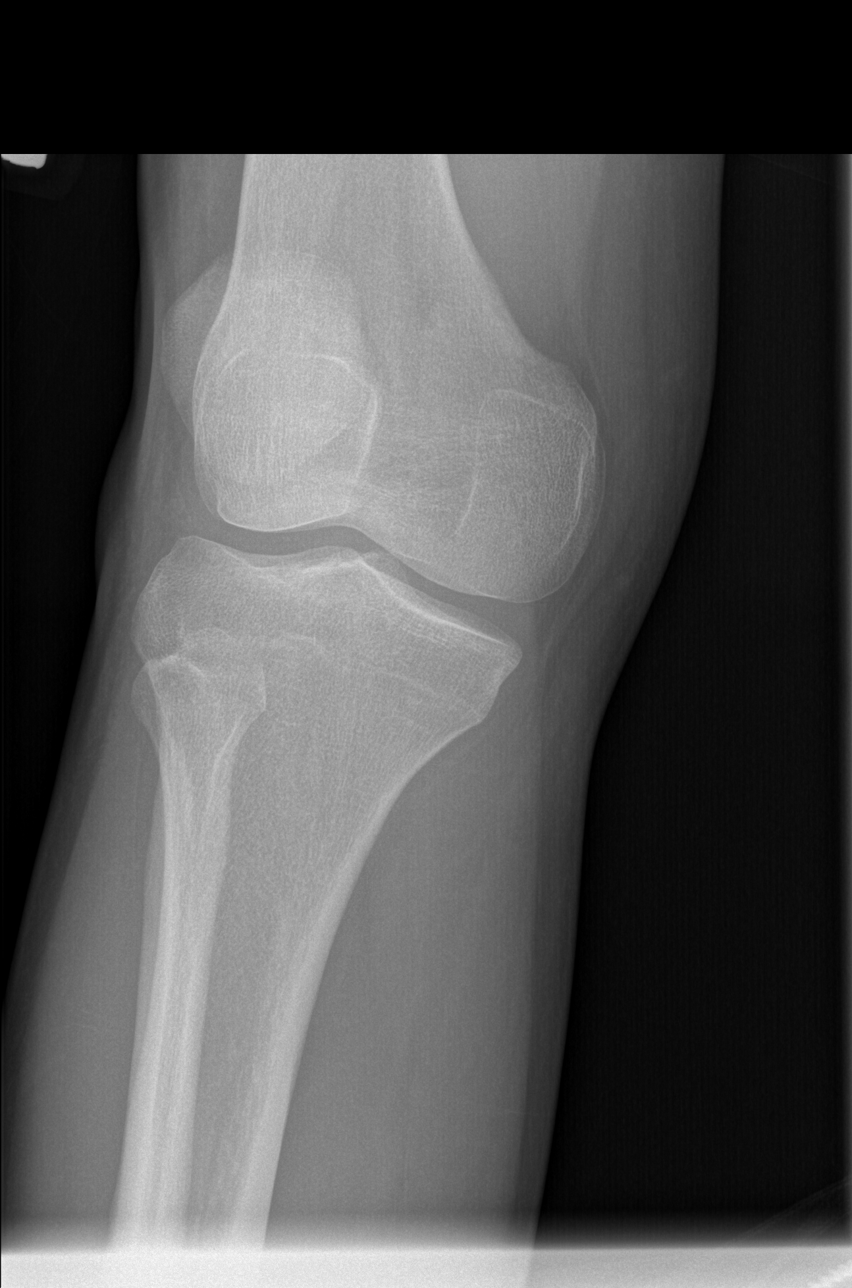

[t knee obl right]
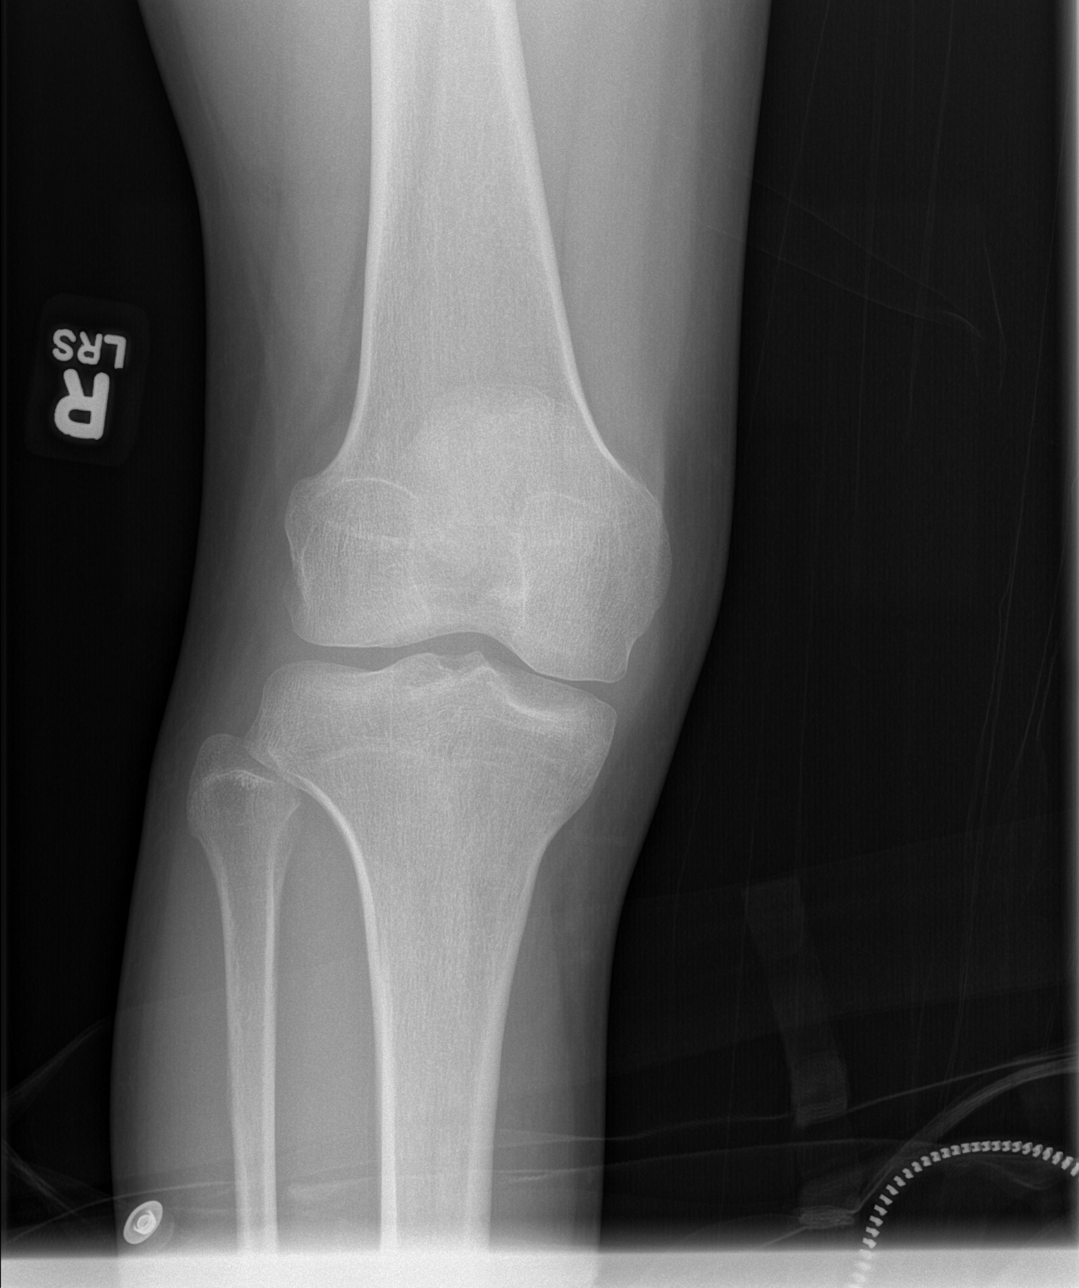

[t knee lat right (1 of 2)]
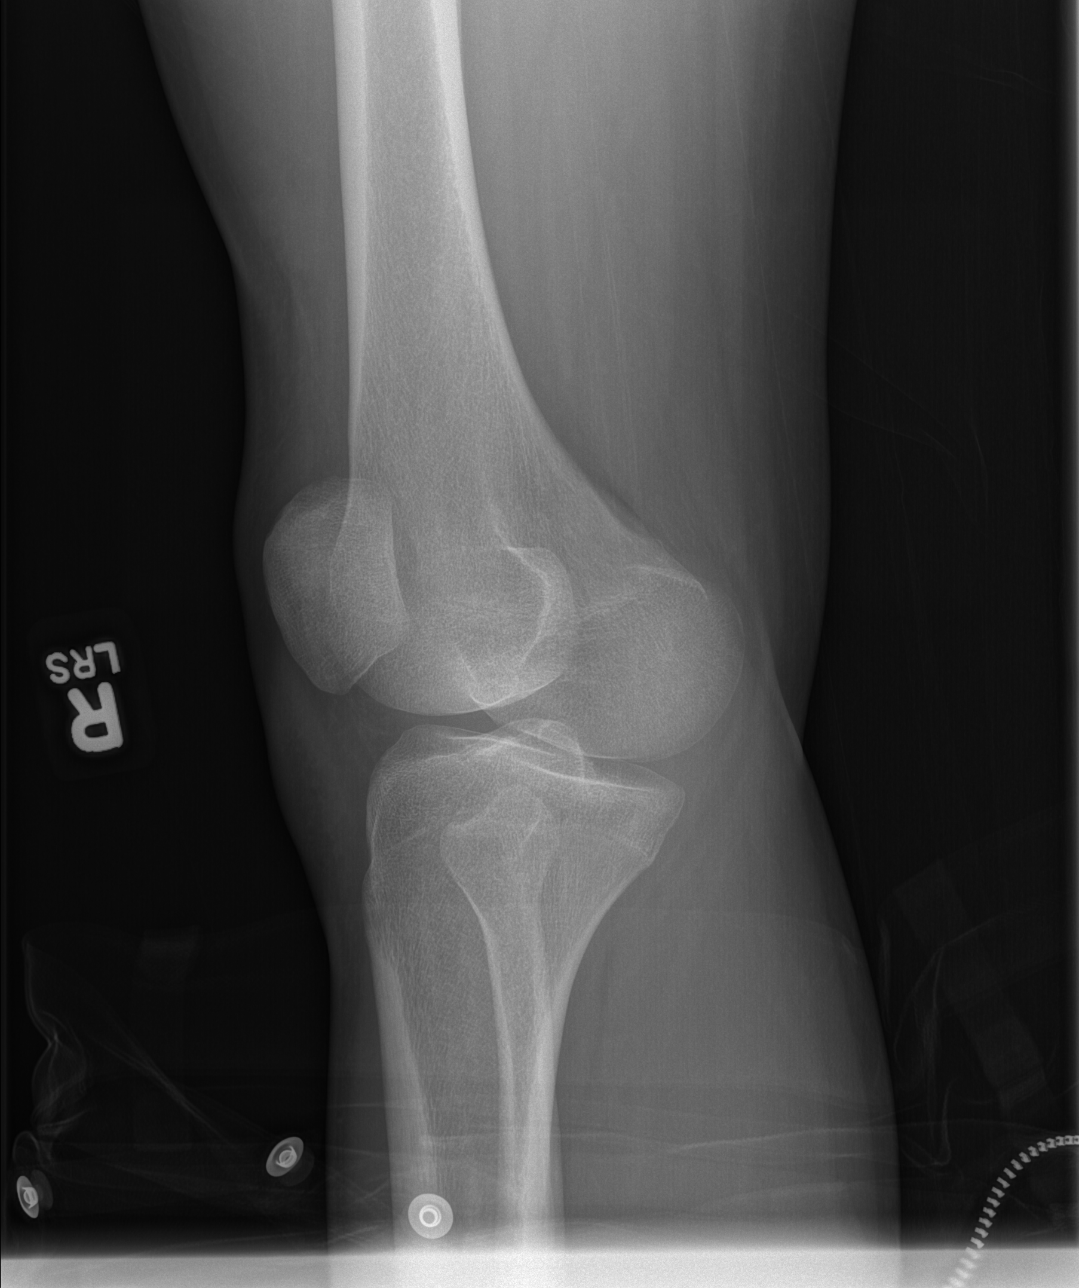

[t knee lat right (2 of 2)]
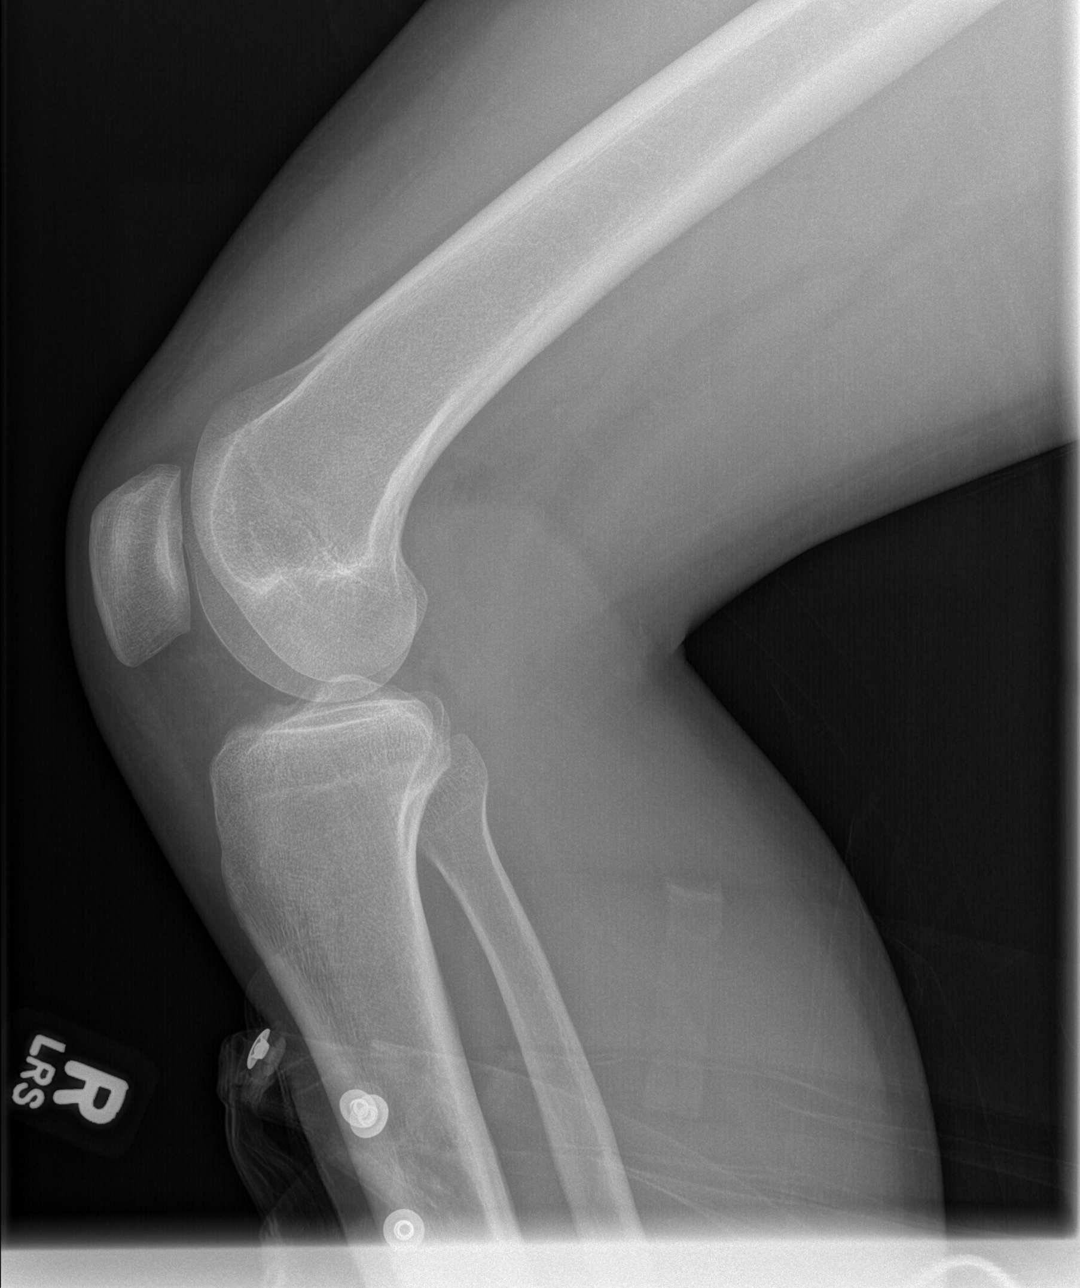

[4 of 4 positions shown; findings below may reference images not displayed]

FINDINGS: No evidence of fracture, dislocation, or joint effusion. No evidence
of arthropathy or other focal bone abnormality. Soft tissues are
unremarkable.
IMPRESSION: Negative.

## 2020-01-31 ENCOUNTER — Emergency Department (HOSPITAL_COMMUNITY): Payer: Medicaid Other

## 2020-01-31 ENCOUNTER — Other Ambulatory Visit: Payer: Self-pay

## 2020-01-31 ENCOUNTER — Emergency Department (HOSPITAL_COMMUNITY)
Admission: EM | Admit: 2020-01-31 | Discharge: 2020-01-31 | Disposition: A | Discharge status: Home / Self Care | Payer: Medicaid Other | Attending: Emergency Medicine | Admitting: Emergency Medicine

## 2020-01-31 DIAGNOSIS — X58XXXA Exposure to other specified factors, initial encounter: Secondary | ICD-10-CM | POA: Insufficient documentation

## 2020-01-31 DIAGNOSIS — Y93B9 Activity, other involving muscle strengthening exercises: Secondary | ICD-10-CM | POA: Diagnosis not present

## 2020-01-31 DIAGNOSIS — M25512 Pain in left shoulder: Secondary | ICD-10-CM | POA: Diagnosis present

## 2020-01-31 DIAGNOSIS — F17210 Nicotine dependence, cigarettes, uncomplicated: Secondary | ICD-10-CM | POA: Insufficient documentation

## 2020-01-31 NOTE — ED Provider Notes (Signed)
[name] EMERGENCY DEPARTMENT Provider Note   CSN: [id number] Arrival date & time: 01/31/20  0005     History Chief Complaint  Patient presents with  . Shoulder Pain    [name] is a 24 y.o. male.  The history is provided by the patient.  Shoulder Pain Location:  Shoulder Shoulder location:  L shoulder Injury: yes   Pain details:    Quality:  Aching   Radiates to:  Does not radiate   Severity:  Mild   Onset quality:  Gradual   Duration:  3 weeks   Timing:  Intermittent Relieved by:  Rest Associated symptoms: no fever   Patient reports left shoulder pain for up to 3 weeks.  He thinks he injured it while working out.  He thinks it may be "out of socket"     Past Medical History:  Diagnosis Date  . Autism   . Bipolar 1 disorder (HCC)   . Seizures (HCC)    none since 24 years old    Patient Active Problem List   Diagnosis Date Noted  . 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 TOOTH EXTRACTION         No family history on file.  Social History   Tobacco Use  . Smoking status: Current Some Day Smoker    Packs/day: 4.00    Types: Cigarettes  . Smokeless tobacco: Never Used  . Tobacco comment: 4 packs a day  Vaping Use  . Vaping Use: Former  Substance Use Topics  . Alcohol use: No  . Drug use: No    Comment: "BD"- stated he took one time     Home Medications Prior to Admission medications   Medication Sig Start Date End Date Taking? Authorizing Provider  albuterol (VENTOLIN HFA) 108 (90 Base) MCG/ACT inhaler Inhale 1-2 puffs into the lungs every 6 (six) hours as needed for wheezing or shortness of breath. 12/14/19   Placido Sou, PA-C  benztropine (COGENTIN) 1 MG tablet Take 1 mg by mouth at bedtime. 07/09/19   [provider]  diphenhydrAMINE (BENADRYL) 25 MG tablet Take 1 tablet (25 mg total) by mouth every 6  (six) hours as needed. 07/31/19   Margarita Grizzle, MD  famotidine (PEPCID) 20 MG tablet Take 1 tablet (20 mg total) by mouth 2 (two) times daily. 10/02/19   Vanetta Mulders, MD  guanFACINE (INTUNIV) 1 MG TB24 ER tablet Take 1 mg by mouth at bedtime. 07/09/19   [provider]  guanFACINE (TENEX) 1 MG tablet Take 1 mg by mouth every morning. 07/09/19   [provider]  naproxen (NAPROSYN) 500 MG tablet Take 1 tablet (500 mg total) by mouth 2 (two) times daily. 07/25/19   Derwood Kaplan, MD  OLANZapine (ZYPREXA) 15 MG tablet Take 15 mg by mouth at bedtime. 07/09/19   [provider]  predniSONE (DELTASONE) 10 MG tablet Take 2 tablets (20 mg total) by mouth 2 (two) times daily with a meal. 07/31/19   Margarita Grizzle, MD  traZODone (DESYREL) 50 MG tablet Take 50-100 mg by mouth at bedtime as needed for sleep.  07/09/19   [provider]    Allergies    Daytrana [methylphenidate]  Review of Systems   Review of Systems  Constitutional: Negative for fever.  Musculoskeletal: Positive for arthralgias.    Physical Exam Updated Vital Signs BP 114/77 (BP Location: Right Arm)  Pulse 81   Temp 98.2 F (36.8 C) (Oral)   Resp 16   SpO2 98%   Physical Exam CONSTITUTIONAL: Disheveled, no acute distress HEAD: Normocephalic/atraumatic EYES: EOMI ENMT: Mucous membranes moist NECK: supple no meningeal signs SPINE/BACK:entire spine nontender LUNGS: no apparent distress ABDOMEN: soft NEURO: Pt is awake/alert/appropriate, moves all extremitiesx4.  No facial droop.   EXTREMITIES: pulses normal/equal, full ROM, tenderness noted to left shoulder, no deformities.  No erythema or warmth.  Distal pulses equal/intact. He can range the left shoulder but limited due to pain. SKIN: warm, color normal PSYCH: no abnormalities of mood noted, alert and oriented to situation  ED Results / Procedures / Treatments   Labs (all labs ordered are listed, but only abnormal results are  displayed) Labs Reviewed - No data to display  EKG None  Radiology DG Shoulder Left  Result Date: 01/31/2020 CLINICAL DATA:  Left shoulder pain for 2-3 weeks. EXAM: LEFT SHOULDER - 2+ VIEW COMPARISON:  None. FINDINGS: There is no evidence of fracture or dislocation. There is no evidence of arthropathy or other focal bone abnormality. Soft tissues are unremarkable. IMPRESSION: Negative left shoulder radiographs. Electronically Signed   By: Marin Roberts M.D.   On: 01/31/2020 05:56    Procedures Procedures   Medications Ordered in ED Medications - No data to display  ED Course  I have reviewed the triage vital signs and the nursing notes.  Pertinent  imaging results that were available during my care of the patient were reviewed by me and considered in my medical decision making (see chart for details).    MDM Rules/Calculators/A&P                          Left shoulder x-ray negative.  Patient has full range of motion of shoulder.  Will discharge Final Clinical Impression(s) / ED Diagnoses Final diagnoses:  Acute pain of left shoulder    Rx / DC Orders ED Discharge Orders    None       Zadie Rhine, MD 01/31/20 [phone]

## 2020-01-31 NOTE — ED Triage Notes (Signed)
Left Shoulder pain x 3 weeks

## 2020-01-31 NOTE — ED Notes (Signed)
Patient states left shoulder pain for around 2-3 weeks after working out one day.

## 2020-02-12 ENCOUNTER — Emergency Department (HOSPITAL_COMMUNITY)
Admission: EM | Admit: 2020-02-12 | Discharge: 2020-02-12 | Disposition: A | Discharge status: Home / Self Care | Payer: Medicaid Other | Attending: Emergency Medicine | Admitting: Emergency Medicine

## 2020-02-12 ENCOUNTER — Encounter (HOSPITAL_COMMUNITY): Payer: Self-pay

## 2020-02-12 DIAGNOSIS — Z5321 Procedure and treatment not carried out due to patient leaving prior to being seen by health care provider: Secondary | ICD-10-CM | POA: Insufficient documentation

## 2020-02-12 DIAGNOSIS — R040 Epistaxis: Secondary | ICD-10-CM | POA: Diagnosis present

## 2020-02-12 NOTE — ED Triage Notes (Signed)
Pt comes via [facility] for nosebleed, no bleeding now

## 2020-02-12 NOTE — ED Notes (Signed)
Pt states that he is leaving due to wait times  

## 2020-03-29 ENCOUNTER — Encounter (HOSPITAL_COMMUNITY): Payer: Self-pay | Admitting: Emergency Medicine

## 2020-03-29 ENCOUNTER — Emergency Department (HOSPITAL_COMMUNITY)
Admission: EM | Admit: 2020-03-29 | Discharge: 2020-03-29 | Disposition: A | Discharge status: Home / Self Care | Payer: Medicaid Other

## 2020-03-29 ENCOUNTER — Emergency Department (HOSPITAL_COMMUNITY)
Admission: EM | Admit: 2020-03-29 | Discharge: 2020-03-29 | Disposition: A | Discharge status: Home / Self Care | Payer: Medicaid Other | Attending: Emergency Medicine | Admitting: Emergency Medicine

## 2020-03-29 ENCOUNTER — Other Ambulatory Visit: Payer: Self-pay

## 2020-03-29 DIAGNOSIS — R519 Headache, unspecified: Secondary | ICD-10-CM | POA: Insufficient documentation

## 2020-03-29 DIAGNOSIS — Z5321 Procedure and treatment not carried out due to patient leaving prior to being seen by health care provider: Secondary | ICD-10-CM | POA: Insufficient documentation

## 2020-03-29 DIAGNOSIS — M79602 Pain in left arm: Secondary | ICD-10-CM | POA: Insufficient documentation
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
## 2020-03-29 NOTE — ED Notes (Signed)
Pt LWBS. Felt he had been waiting too long

## 2020-03-29 NOTE — ED Notes (Signed)
Pt was called for triage no answer

## 2020-04-02 ENCOUNTER — Emergency Department (HOSPITAL_COMMUNITY): Payer: Medicaid Other

## 2020-04-02 ENCOUNTER — Emergency Department (HOSPITAL_COMMUNITY)
Admission: EM | Admit: 2020-04-02 | Discharge: 2020-04-02 | Disposition: A | Discharge status: Home / Self Care | Payer: Medicaid Other | Attending: Emergency Medicine | Admitting: Emergency Medicine

## 2020-04-02 ENCOUNTER — Other Ambulatory Visit: Payer: Self-pay

## 2020-04-02 ENCOUNTER — Encounter (HOSPITAL_COMMUNITY): Payer: Self-pay | Admitting: *Deleted

## 2020-04-02 DIAGNOSIS — S59912A Unspecified injury of left forearm, initial encounter: Secondary | ICD-10-CM | POA: Diagnosis present

## 2020-04-02 DIAGNOSIS — F17210 Nicotine dependence, cigarettes, uncomplicated: Secondary | ICD-10-CM | POA: Diagnosis not present

## 2020-04-02 DIAGNOSIS — S5012XA Contusion of left forearm, initial encounter: Secondary | ICD-10-CM | POA: Diagnosis not present

## 2020-04-02 DIAGNOSIS — M545 Low back pain, unspecified: Secondary | ICD-10-CM | POA: Insufficient documentation

## 2020-04-02 DIAGNOSIS — Y92009 Unspecified place in unspecified non-institutional (private) residence as the place of occurrence of the external cause: Secondary | ICD-10-CM | POA: Insufficient documentation

## 2020-04-02 DIAGNOSIS — W501XXA Accidental kick by another person, initial encounter: Secondary | ICD-10-CM | POA: Diagnosis not present

## 2020-04-02 DIAGNOSIS — G8929 Other chronic pain: Secondary | ICD-10-CM | POA: Diagnosis not present

## 2020-04-02 MED ORDER — IBUPROFEN 400 MG PO TABS
600.0000 mg | ORAL_TABLET | Freq: Once | ORAL | Status: AC
  Administered 2020-04-02: 09:00:00 600 mg via ORAL
  Filled 2020-04-02: qty 1
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
## 2020-04-02 NOTE — Discharge Instructions (Signed)
If you develop worsening, recurrent, or continued back pain, numbness or weakness in the legs, incontinence of your bowels or bladders, numbness of your buttocks, fever, abdominal pain, or any other new/concerning symptoms then return to the ER for evaluation.  

## 2020-04-02 NOTE — ED Notes (Signed)
Patient Alert and oriented to baseline. Stable and ambulatory to baseline. Patient verbalized understanding of the discharge instructions.  Patient belongings were taken by the patient.   

## 2020-04-02 NOTE — ED Triage Notes (Signed)
The pt has decided he wants his lower back checked also.  He wants his lt forearm and his lower back xrayed

## 2020-04-16 ENCOUNTER — Emergency Department (HOSPITAL_COMMUNITY)
Admission: EM | Admit: 2020-04-16 | Discharge: 2020-04-16 | Disposition: A | Discharge status: Home / Self Care | Payer: Medicaid Other | Attending: Emergency Medicine | Admitting: Emergency Medicine

## 2020-04-16 ENCOUNTER — Other Ambulatory Visit: Payer: Self-pay

## 2020-04-16 ENCOUNTER — Encounter (HOSPITAL_COMMUNITY): Payer: Self-pay | Admitting: Emergency Medicine

## 2020-04-16 ENCOUNTER — Emergency Department (HOSPITAL_COMMUNITY): Payer: Medicaid Other

## 2020-04-16 DIAGNOSIS — F17210 Nicotine dependence, cigarettes, uncomplicated: Secondary | ICD-10-CM | POA: Insufficient documentation

## 2020-04-16 DIAGNOSIS — J45909 Unspecified asthma, uncomplicated: Secondary | ICD-10-CM | POA: Insufficient documentation

## 2020-04-16 DIAGNOSIS — S6992XA Unspecified injury of left wrist, hand and finger(s), initial encounter: Secondary | ICD-10-CM | POA: Diagnosis present

## 2020-04-16 DIAGNOSIS — S60222A Contusion of left hand, initial encounter: Secondary | ICD-10-CM | POA: Insufficient documentation

## 2020-04-16 MED ORDER — IBUPROFEN 400 MG PO TABS
600.0000 mg | ORAL_TABLET | Freq: Once | ORAL | Status: AC
  Administered 2020-04-16: 08:00:00 600 mg via ORAL
  Filled 2020-04-16: qty 1
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
## 2020-04-16 NOTE — ED Notes (Signed)
Pt discharge instructions reviewed with the patient. The patient verbalized understanding of instructions. Pt discharged. 

## 2020-05-06 ENCOUNTER — Encounter: Payer: Self-pay | Admitting: *Deleted

## 2020-05-06 NOTE — Congregational Nurse Program (Signed)
  Dept: [phone]   Congregational Nurse Program Note  Date of Encounter: 05/06/2020  Past Medical History: Past Medical History:  Diagnosis Date  . Autism   . Bipolar 1 disorder (HCC)   . Seizures (HCC)    none since 25 years old    Encounter Details:  CNP Questionnaire - 05/06/20 1516      Questionnaire   Do you give verbal consent to treat you today? Yes    Visit Setting Church or Organization    Location Patient Served At [facility]    Patient Status Homeless    Medical Provider No    Insurance [employer];Refer;Support    Housing/Utilities No permanent housing    Referrals PCP - [place]          Followed up with client after recent ED visit. Client reports he has ongoing back discomfort. He does not have a primary doctor and would like to have a PCP, dentist and eye exam. Client said he was leaving [facility] and agreed to see writer next Wednesday when he picked up his mail. Told client Clinical research associate would work on getting him appointments and see him next week. He is not interested in any behavioral health medications and plans to move to [facility] near relatives in the furture. He denies si and hi. Also referred to [facility] for housing.  Breena Bevacqua W RN CN [phone]

## 2020-05-08 ENCOUNTER — Telehealth: Payer: Self-pay | Admitting: *Deleted

## 2020-05-08 NOTE — Telephone Encounter (Signed)
Left message for a return call

## 2020-05-20 ENCOUNTER — Emergency Department (HOSPITAL_COMMUNITY)
Admission: EM | Admit: 2020-05-20 | Discharge: 2020-05-20 | Disposition: A | Discharge status: Home / Self Care | Payer: Medicaid Other | Attending: Emergency Medicine | Admitting: Emergency Medicine

## 2020-05-20 ENCOUNTER — Encounter (HOSPITAL_COMMUNITY): Payer: Self-pay | Admitting: *Deleted

## 2020-05-20 ENCOUNTER — Other Ambulatory Visit: Payer: Self-pay

## 2020-05-20 DIAGNOSIS — F17210 Nicotine dependence, cigarettes, uncomplicated: Secondary | ICD-10-CM | POA: Insufficient documentation

## 2020-05-20 DIAGNOSIS — M546 Pain in thoracic spine: Secondary | ICD-10-CM | POA: Diagnosis present

## 2020-05-20 DIAGNOSIS — F840 Autistic disorder: Secondary | ICD-10-CM | POA: Diagnosis not present

## 2020-05-20 DIAGNOSIS — M549 Dorsalgia, unspecified: Secondary | ICD-10-CM

## 2020-05-20 MED ORDER — LIDOCAINE 5 % EX PTCH
1.0000 | MEDICATED_PATCH | CUTANEOUS | Status: DC
  Administered 2020-05-20: 1 via TRANSDERMAL
  Filled 2020-05-20: qty 1

## 2020-05-20 MED ORDER — IBUPROFEN 400 MG PO TABS
600.0000 mg | ORAL_TABLET | Freq: Once | ORAL | Status: AC
  Administered 2020-05-20: 600 mg via ORAL
  Filled 2020-05-20: qty 1

## 2020-05-20 MED ORDER — NAPROXEN 500 MG PO TABS: 500.0000 mg | ORAL_TABLET | Freq: Two times a day (BID) | ORAL | 0 refills | Status: DC

## 2020-05-20 NOTE — ED Triage Notes (Signed)
Pt states when it gets cold out, his back starts to hurt. Also says he is out of his ADHD medications. Ambulatory with steady gait to lobby.

## 2020-05-20 NOTE — ED Notes (Signed)
Pt won't cooperate and sit up for vitals to be taken.

## 2020-05-20 NOTE — Discharge Instructions (Addendum)
Take naproxen 2 times a day with meals.  Do not take other anti-inflammatories at the same time (Advil, Motrin, ibuprofen, Aleve). You may supplement with Tylenol if you need further pain control. Follow-up with the clinic listed below to set up primary care. Return to the emergency room if you develop high fevers, loss of bowel or bladder control, vomiting, or any new, worsening, or concerning symptoms.

## 2020-05-20 NOTE — ED Triage Notes (Signed)
[place] [facility] [place]. Ambulatory with steady gait to triage. VSS

## 2020-05-20 NOTE — ED Provider Notes (Signed)
[name] EMERGENCY DEPARTMENT Provider Note   CSN: [id number] Arrival date & time: 05/20/20  0200     History Chief Complaint  Patient presents with  . Back Pain    [name] is a 25 y.o. male presenting for evaluation of back pain.  Patient states he has chronic back pain.  It has worsened recently due to it being cold.  He describes it as a spasm.  Pain is in the mid back.  This is where he always has pain.  Besides being slightly more severe than normal, it is otherwise not different or changed from his normal pain.  He denies fevers, chills, nausea, vomiting, urinary symptoms, loss of bowel bladder control.  He denies numbness or tingling in his legs.  No history of cancer or IVDU.  He denies trauma or injury.  He has not taken anything for it including Tylenol or ibuprofen.  He reports he currently takes no medications.  HPI     Past Medical History:  Diagnosis Date  . Autism   . Bipolar 1 disorder (HCC)   . Seizures (HCC)    none since 25 years old    Patient Active Problem List   Diagnosis Date Noted  . 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 TOOTH EXTRACTION         No family history on file.  Social History   Tobacco Use  . Smoking status: Current Some Day Smoker    Types: Cigarettes  . Smokeless tobacco: Never Used  Vaping Use  . Vaping Use: Former  Substance Use Topics  . Alcohol use: No  . Drug use: No    Comment: "BD"- stated he took one time     Home Medications Prior to Admission medications   Medication Sig Start Date End Date Taking? Authorizing Provider  naproxen (NAPROSYN) 500 MG tablet Take 1 tablet (500 mg total) by mouth 2 (two) times daily with a meal. 05/20/20  Yes Keyleigh Manninen, PA-C  albuterol (VENTOLIN HFA) 108 (90 Base) MCG/ACT inhaler Inhale 1-2 puffs into the lungs every 6 (six) hours as needed for  wheezing or shortness of breath. 12/14/19   Placido Sou, PA-C  diphenhydrAMINE (BENADRYL) 25 MG tablet Take 1 tablet (25 mg total) by mouth every 6 (six) hours as needed. Patient not taking: Reported on 02/12/2020 07/31/19   Margarita Grizzle, MD  famotidine (PEPCID) 20 MG tablet Take 1 tablet (20 mg total) by mouth 2 (two) times daily. Patient not taking: Reported on 02/12/2020 10/02/19   Vanetta Mulders, MD  predniSONE (DELTASONE) 10 MG tablet Take 2 tablets (20 mg total) by mouth 2 (two) times daily with a meal. Patient not taking: Reported on 02/12/2020 07/31/19   Margarita Grizzle, MD    Allergies    Daytrana [methylphenidate]  Review of Systems   Review of Systems  Musculoskeletal: Positive for back pain.  All other systems reviewed and are negative.   Physical Exam Updated Vital Signs BP 122/80 (BP Location: Right Arm)   Pulse 61   Temp 97.8 F (36.6 C) (Oral)   Resp 16   SpO2 99%   Physical Exam Vitals and nursing note reviewed.  Constitutional:      General: He is not in acute distress.    Appearance: He is well-developed and well-nourished.     Comments: Resting in the bed in no acute distress  HENT:  Head: Normocephalic and atraumatic.  Eyes:     Extraocular Movements: EOM normal.     Conjunctiva/sclera: Conjunctivae normal.     Pupils: Pupils are equal, round, and reactive to light.  Cardiovascular:     Rate and Rhythm: Normal rate and regular rhythm.     Pulses: Normal pulses and intact distal pulses.  Pulmonary:     Effort: Pulmonary effort is normal. No respiratory distress.     Breath sounds: Normal breath sounds. No wheezing.  Abdominal:     General: Bowel sounds are normal. There is no distension.     Palpations: Abdomen is soft.     Tenderness: There is no abdominal tenderness.  Musculoskeletal:        General: Tenderness present. Normal range of motion.     Cervical back: Normal range of motion and neck supple.       Back:     Comments:  Tenderness palpation of mid back.  No focal pain over midline spine.  No TTP of upper or lower back.  Ambulatory without difficulty or signs of pain. Strength of lower extremities equal bilaterally.  Good distal sensation and cap refill. No saddle paresthesias.  Skin:    General: Skin is warm and dry.     Capillary Refill: Capillary refill takes less than 2 seconds.  Neurological:     Mental Status: He is alert and oriented to person, place, and time.  Psychiatric:        Mood and Affect: Mood and affect normal.     ED Results / Procedures / Treatments   Labs (all labs ordered are listed, but only abnormal results are displayed) Labs Reviewed - No data to display  EKG None  Radiology No results found.  Procedures Procedures   Medications Ordered in ED Medications  lidocaine (LIDODERM) 5 % 1 patch (1 patch Transdermal Patch Applied 05/20/20 1012)  ibuprofen (ADVIL) tablet 600 mg (600 mg Oral Given 05/20/20 1012)    ED Course  I have reviewed the triage vital signs and the nursing notes.  Pertinent labs & imaging results that were available during my care of the patient were reviewed by me and considered in my medical decision making (see chart for details).    MDM Rules/Calculators/A&P                          Patient presenting for evaluation of worsening chronic back pain.  On exam, he appears nontoxic.  No new neurologic deficits.  No red flags of back pain.  Pain is reproducible with palpation of the musculature, likely muscle pain.  He states he has had normal x-rays in the past, I do not believe he needs emergent x-rays or further imaging at this time as I have low suspicion for infection including discitis or concerning myelopathy/cauda equina syndrome.  As patient is without with fevers, vomiting, urinary symptoms, doubt UTI/Pyelo.  Discussed symptomatic control with anti-inflammatories, follow-up with PCP.  Resources given.  At this time, patient appears safe for  discharge.  Return precautions given.  Patient states he understands and agrees to plan  Final Clinical Impression(s) / ED Diagnoses Final diagnoses:  Mid back pain    Rx / DC Orders ED Discharge Orders         Ordered    naproxen (NAPROSYN) 500 MG tablet  2 times daily with meals        05/20/20 1000  Alveria Apley, PA-C 05/20/20 1046    Maia Plan, MD 05/21/20 1010

## 2020-05-26 ENCOUNTER — Emergency Department (HOSPITAL_COMMUNITY)
Admission: EM | Admit: 2020-05-26 | Discharge: 2020-05-27 | Disposition: A | Discharge status: Home / Self Care | Payer: Medicaid Other | Attending: Emergency Medicine | Admitting: Emergency Medicine

## 2020-05-26 ENCOUNTER — Encounter (HOSPITAL_COMMUNITY): Payer: Self-pay

## 2020-05-26 DIAGNOSIS — M549 Dorsalgia, unspecified: Secondary | ICD-10-CM | POA: Diagnosis present

## 2020-05-26 DIAGNOSIS — F17210 Nicotine dependence, cigarettes, uncomplicated: Secondary | ICD-10-CM | POA: Diagnosis not present

## 2020-05-26 DIAGNOSIS — F840 Autistic disorder: Secondary | ICD-10-CM | POA: Diagnosis not present

## 2020-05-26 DIAGNOSIS — G8929 Other chronic pain: Secondary | ICD-10-CM

## 2020-05-26 NOTE — ED Triage Notes (Signed)
Pt comes via [facility] for back pain for 6 years and he is almost out of his naproxen

## 2020-05-27 MED ORDER — LIDOCAINE 5 % EX PTCH: 1.0000 | MEDICATED_PATCH | Freq: Every day | CUTANEOUS | 0 refills | Status: DC | PRN

## 2020-05-27 NOTE — ED Provider Notes (Signed)
[facility] EMERGENCY DEPARTMENT Provider Note   CSN: [id number] Arrival date & time: 05/26/20  2052     History Chief Complaint  Patient presents with  . Back Pain    [name] is a 25 y.o. male with a hx of bipolar 1 disorder & autism who presents to the ED with complaints of back pain intermittently x 6 years. Pain is to bilateral mid to lower back, worse with certain movements, no alleviating factors. No recent trauma/injuries. Denies numbness, tingling, weakness, saddle anesthesia, incontinence to bowel/bladder, fever, chills, IV drug use, dysuria, or hx of cancer. Patient has not had prior back surgeries.   HPI     Past Medical History:  Diagnosis Date  . Autism   . Bipolar 1 disorder (HCC)   . Seizures (HCC)    none since 25 years old    Patient Active Problem List   Diagnosis Date Noted  . 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 TOOTH EXTRACTION         No family history on file.  Social History   Tobacco Use  . Smoking status: Current Some Day Smoker    Types: Cigarettes  . Smokeless tobacco: Never Used  Vaping Use  . Vaping Use: Former  Substance Use Topics  . Alcohol use: No  . Drug use: No    Comment: "BD"- stated he took one time     Home Medications Prior to Admission medications   Medication Sig Start Date End Date Taking? Authorizing Provider  albuterol (VENTOLIN HFA) 108 (90 Base) MCG/ACT inhaler Inhale 1-2 puffs into the lungs every 6 (six) hours as needed for wheezing or shortness of breath. 12/14/19   Placido Sou, PA-C  diphenhydrAMINE (BENADRYL) 25 MG tablet Take 1 tablet (25 mg total) by mouth every 6 (six) hours as needed. Patient not taking: Reported on 02/12/2020 07/31/19   Margarita Grizzle, MD  famotidine (PEPCID) 20 MG tablet Take 1 tablet (20 mg total) by mouth 2 (two) times daily. Patient not taking:  Reported on 02/12/2020 10/02/19   Vanetta Mulders, MD  naproxen (NAPROSYN) 500 MG tablet Take 1 tablet (500 mg total) by mouth 2 (two) times daily with a meal. 05/20/20   Caccavale, Sophia, PA-C  predniSONE (DELTASONE) 10 MG tablet Take 2 tablets (20 mg total) by mouth 2 (two) times daily with a meal. Patient not taking: Reported on 02/12/2020 07/31/19   Margarita Grizzle, MD    Allergies    Daytrana [methylphenidate]  Review of Systems   Review of Systems  Constitutional: Negative for chills, fever and unexpected weight change.  Gastrointestinal: Negative for abdominal pain, nausea and vomiting.  Genitourinary: Negative for dysuria.  Musculoskeletal: Positive for back pain.  Neurological: Negative for weakness and numbness.       Negative for saddle anesthesia or bowel/bladder incontinence.     Physical Exam Updated Vital Signs BP 122/76 (BP Location: Right Arm)   Pulse 84   Temp 98 F (36.7 C) (Oral)   Resp 15   SpO2 94%   Physical Exam Constitutional:      General: He is not in acute distress.    Appearance: He is well-developed and well-nourished. He is not toxic-appearing.  HENT:     Head: Normocephalic and atraumatic.  Cardiovascular:     Pulses: Intact distal pulses.  Abdominal:     Palpations: Abdomen is soft.  Tenderness: There is no abdominal tenderness. There is no guarding or rebound.  Musculoskeletal:     Cervical back: Normal range of motion and neck supple. No spinous process tenderness or muscular tenderness.     Comments: No obvious deformity, appreciable swelling, erythema, ecchymosis, significant open wounds, or increased warmth.  Extremities: Normal ROM. Nontender.  Back: No point/focal vertebral tenderness, no palpable step off or crepitus. Lower thoracic/upper lumbar paraspinal muscle tenderness bilaterally. No overlying rashes.   Skin:    General: Skin is warm and dry.     Findings: No rash.  Neurological:     Mental Status: He is alert.     Deep  Tendon Reflexes:     Reflex Scores:      Patellar reflexes are 2+ on the right side and 2+ on the left side.    Comments: Sensation grossly intact to bilateral lower extremities. 5/5 symmetric strength with knee flexion/extension and ankle plantar/dorsiflexion bilaterally. Gait is intact without obvious foot drop.      ED Results / Procedures / Treatments   Labs (all labs ordered are listed, but only abnormal results are displayed) Labs Reviewed - No data to display  EKG None  Radiology No results found.  Procedures Procedures   Medications Ordered in ED Medications - No data to display  ED Course  I have reviewed the triage vital signs and the nursing notes.  Pertinent labs & imaging results that were available during my care of the patient were reviewed by me and considered in my medical decision making (see chart for details).    MDM Rules/Calculators/A&P                          Patient presents with complaint of back pain.  Patient is nontoxic appearing, vitals are without significant abnormality. Patient has normal neurologic exam, no point/focal midline tenderness to palpation. Ambulatory in the ED.  No back pain red flags. No urinary sxs. Most likely muscle strain versus spasm, chronic in nature intermittently per history provided by patient.  No recent trauma/injury. Considered fracture, UTI/pyelonephritis, kidney stone, aortic aneurysm/dissection, cauda equina or epidural abscess however do not feel these diagnoses fit clinical picture at this time. Will treat lidoderm patches, recent prescription for naproxen provided- encouraged this be filled. I discussed treatment plan, need for PCP follow-up, and return precautions with the patient. Provided opportunity for questions, patient confirmed understanding and is in agreement with plan.   Final Clinical Impression(s) / ED Diagnoses Final diagnoses:  Chronic bilateral back pain, unspecified back location    Rx / DC  Orders ED Discharge Orders         Ordered    lidocaine (LIDODERM) 5 %  Daily PRN        05/27/20 0357           Cherly Anderson, PA-C 05/27/20 0401    Mesner, Barbara Cower, MD 05/27/20 [phone]

## 2020-05-27 NOTE — Discharge Instructions (Addendum)
You were seen in the emergency department for back pain today.  At this time we suspect that your pain is related to a muscle strain/spasm.   We have prescribed lidoderm patches- apply 1 patch to your area of most significant pain once per day and remove within 12 hours of application.   You make take Tylenol per over the counter dosing as well as the previously prescribed Naproxen from your last ED visit with the lidoderm patches.   We have prescribed you new medication(s) today. Discuss the medications prescribed today with your pharmacist as they can have adverse effects and interactions with your other medicines including over the counter and prescribed medications. Seek medical evaluation if you start to experience new or abnormal symptoms after taking one of these medicines, seek care immediately if you start to experience difficulty breathing, feeling of your throat closing, facial swelling, or rash as these could be indications of a more serious allergic reaction   The application of heat can help soothe the pain.  Maintaining your daily activities, including walking, is encourged, as it will help you get better faster than just staying in bed.  Your pain should get better over the next 2 weeks.  You will need to follow up with  Your primary healthcare provider in 1-2 weeks for reassessment, if you do not have a primary care provider one is provided in your discharge instructions- you may see the [facility] or call the provided phone number. However return to the ER should you develop ne or worsening symptoms or any other concerns including but not limited to severe or worsening pain, low back pain with fever, numbness, weakness, loss of bowel or bladder control, or inability to walk or urinate, you should return to the ER immediately.

## 2020-05-31 ENCOUNTER — Encounter (HOSPITAL_COMMUNITY): Payer: Self-pay | Admitting: Student

## 2020-05-31 ENCOUNTER — Emergency Department (HOSPITAL_COMMUNITY)
Admission: EM | Admit: 2020-05-31 | Discharge: 2020-05-31 | Disposition: A | Discharge status: Home / Self Care | Payer: Medicaid Other | Attending: Emergency Medicine | Admitting: Emergency Medicine

## 2020-05-31 ENCOUNTER — Other Ambulatory Visit: Payer: Self-pay

## 2020-05-31 DIAGNOSIS — F840 Autistic disorder: Secondary | ICD-10-CM | POA: Insufficient documentation

## 2020-05-31 DIAGNOSIS — F17210 Nicotine dependence, cigarettes, uncomplicated: Secondary | ICD-10-CM | POA: Diagnosis not present

## 2020-05-31 DIAGNOSIS — M545 Low back pain, unspecified: Secondary | ICD-10-CM | POA: Insufficient documentation

## 2020-05-31 MED ORDER — ACETAMINOPHEN ER 650 MG PO TBCR: 650.0000 mg | EXTENDED_RELEASE_TABLET | Freq: Three times a day (TID) | ORAL | 0 refills | Status: DC | PRN

## 2020-05-31 MED ORDER — LIDOCAINE 5 % EX PTCH
1.0000 | MEDICATED_PATCH | CUTANEOUS | Status: DC
  Administered 2020-05-31: 1 via TRANSDERMAL
  Filled 2020-05-31: qty 1

## 2020-05-31 MED ORDER — ACETAMINOPHEN 500 MG PO TABS
1000.0000 mg | ORAL_TABLET | Freq: Once | ORAL | Status: AC
  Administered 2020-05-31: 1000 mg via ORAL
  Filled 2020-05-31: qty 2

## 2020-05-31 MED ORDER — LIDOCAINE 5 % EX PTCH: 1.0000 | MEDICATED_PATCH | Freq: Every day | CUTANEOUS | 0 refills | Status: DC | PRN

## 2020-05-31 NOTE — Discharge Instructions (Signed)
You were seen in the emergency department today for back pain.  We suspect your pain is muscular related.  We are sending you home with Lidoderm patches, please apply 1 patch to area most significant pain once per day.  Remove and discard patch within 12 hours.  We also send you home with Tylenol to take every 8 hours as needed for pain.  Please take your previously prescribed naproxen with these medications as well.  Please apply heat to your lower back.  Please follow-up with your primary care provider within 1 week.  Return to the ER for new or worsening symptoms including but not limited to increased pain, new pain, numbness, weakness, loss of control of bowel or bladder function, fever, inability to walk, urinary symptoms, abdominal pain, or any other concerns.

## 2020-05-31 NOTE — ED Provider Notes (Addendum)
[name] EMERGENCY DEPARTMENT Provider Note   CSN: [id number] Arrival date & time: 05/31/20  2014     History Chief Complaint  Patient presents with  . Back Pain    [name] is a 25 y.o. male with a hx of bipolar 1 disorder, autism, and seizure who presents to the emergency department with complaints of back pain which is acute on chronic today.  Patient states that he sat back pain for a long time, this worsened today when he was trying to help a friend up.  Pain is located to the mid to lower back, constant, worse with movement, no alleviating factors.  States he tried naproxen without relief. Denies numbness, tingling, weakness, saddle anesthesia, incontinence to bowel/bladder, fever, chills, IV drug use, dysuria, or hx of cancer. Patient has not had prior back surgeries. No recent direct trauma/injuries.      HPI     Past Medical History:  Diagnosis Date  . Autism   . Bipolar 1 disorder (HCC)   . Seizures (HCC)    none since 25 years old    Patient Active Problem List   Diagnosis Date Noted  . 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 TOOTH EXTRACTION         History reviewed. No pertinent family history.  Social History   Tobacco Use  . Smoking status: Current Some Day Smoker    Types: Cigarettes  . Smokeless tobacco: Never Used  Vaping Use  . Vaping Use: Former  Substance Use Topics  . Alcohol use: No  . Drug use: No    Comment: "BD"- stated he took one time     Home Medications Prior to Admission medications   Medication Sig Start Date End Date Taking? Authorizing Provider  albuterol (VENTOLIN HFA) 108 (90 Base) MCG/ACT inhaler Inhale 1-2 puffs into the lungs every 6 (six) hours as needed for wheezing or shortness of breath. 12/14/19   Placido Sou, PA-C  lidocaine (LIDODERM) 5 % Place 1 patch onto the skin daily as needed.  Apply patch to area most significant pain once per day.  Remove and discard patch within 12 hours of application. 05/27/20   Anzley Dibbern R, PA-C  naproxen (NAPROSYN) 500 MG tablet Take 1 tablet (500 mg total) by mouth 2 (two) times daily with a meal. 05/20/20   Caccavale, Sophia, PA-C  diphenhydrAMINE (BENADRYL) 25 MG tablet Take 1 tablet (25 mg total) by mouth every 6 (six) hours as needed. Patient not taking: Reported on 02/12/2020 07/31/19 05/27/20  Margarita Grizzle, MD  famotidine (PEPCID) 20 MG tablet Take 1 tablet (20 mg total) by mouth 2 (two) times daily. Patient not taking: Reported on 02/12/2020 10/02/19 05/27/20  Vanetta Mulders, MD    Allergies    Daytrana [methylphenidate]  Review of Systems   Review of Systems  Constitutional: Negative for chills, fever and unexpected weight change.  Gastrointestinal: Negative for abdominal pain, nausea and vomiting.  Genitourinary: Negative for dysuria.  Musculoskeletal: Positive for back pain.  Neurological: Negative for weakness and numbness.       Negative for saddle anesthesia or bowel/bladder incontinence.   All other systems reviewed and are negative.   Physical Exam Updated Vital Signs BP 119/75 (BP Location: Left Arm)   Pulse 75   Temp 98 F (36.7 C) (Oral)   Resp 16   SpO2 99%   Physical Exam Constitutional:  General: He is not in acute distress.    Appearance: He is well-developed and well-nourished. He is not toxic-appearing.  HENT:     Head: Normocephalic and atraumatic.  Cardiovascular:     Rate and Rhythm: Normal rate.     Pulses: Intact distal pulses.  Pulmonary:     Effort: Pulmonary effort is normal.  Abdominal:     General: There is no distension.     Palpations: Abdomen is soft.     Tenderness: There is no abdominal tenderness. There is no right CVA tenderness, left CVA tenderness, guarding or rebound.  Musculoskeletal:     Cervical back: Normal range of motion and neck supple. No spinous process  tenderness or muscular tenderness.     Comments: No obvious deformity, appreciable swelling, erythema, ecchymosis, significant open wounds, or increased warmth.  Extremities: Normal ROM. Nontender.  Back: No overlying skin changes or rashes.  Patient has diffuse tenderness to the lumbar region and lower thoracic region including midline and bilateral paraspinal muscles.  No point/focal vertebral tenderness, no palpable step off or crepitus.  Skin:    General: Skin is warm and dry.     Findings: No rash.  Neurological:     Mental Status: He is alert.     Deep Tendon Reflexes:     Reflex Scores:      Patellar reflexes are 2+ on the right side and 2+ on the left side.    Comments: Sensation grossly intact to bilateral lower extremities. 5/5 symmetric strength with plantar/dorsiflexion bilaterally. Gait is intact without obvious foot drop.      ED Results / Procedures / Treatments   Labs (all labs ordered are listed, but only abnormal results are displayed) Labs Reviewed - No data to display  EKG None  Radiology No results found.  Procedures Procedures   Medications Ordered in ED Medications  lidocaine (LIDODERM) 5 % 1 patch (has no administration in time range)  acetaminophen (TYLENOL) tablet 1,000 mg (has no administration in time range)    ED Course  I have reviewed the triage vital signs and the nursing notes.  Pertinent labs & imaging results that were available during my care of the patient were reviewed by me and considered in my medical decision making (see chart for details).    MDM Rules/Calculators/A&P                          Patient presents to the ED with complaints of back pain  Nontoxic, vitals WNL   Additional history obtained:  Additional history obtained from chart review & nursing note review.  Seen in the ED for similar in the past, including by me personally.  L spine x-ray in December 2021 which was negative.   ED Course:  Patient without back  pain redflags. No point/focal vertebral tenderness or step off to palpation to raise concern for fx. No neuro deficits, ambulatory, doubt cord compression or cauda equina syndrome. No fever or hx of IVDU to suggest epidural abscess. No urinary sxs to raise concern for pyelonephritis/uti or nephrolithiasis. Symmetric pulses, normotensive- doubt dissection.  Likely muscular in nature.  Given a Lidoderm patch and Tylenol in the emergency department.  Will discharge home with Lidoderm patches, continue NSAIDs/Tylenol per over-the-counter dosing. I discussed results, treatment plan, need for follow-up, and return precautions with the patient. Provided opportunity for questions, patient confirmed understanding and is in agreement with plan.    Final Clinical Impression(s) / ED  Diagnoses Final diagnoses:  Bilateral low back pain without sciatica, unspecified chronicity    Rx / DC Orders ED Discharge Orders         Ordered    lidocaine (LIDODERM) 5 %  Daily PRN        05/31/20 2246    acetaminophen (TYLENOL 8 HOUR) 650 MG CR tablet  Every 8 hours PRN        05/31/20 2246           Kweku Stankey, [place] R, PA-C 05/31/20 2248    Malee Grays, Pleas Koch, PA-C 05/31/20 2248    Virgina Norfolk, DO 06/01/20 0018

## 2020-05-31 NOTE — ED Triage Notes (Signed)
Pt presents to ED BIB [facility]. Pt c/o mid back pain which is chronic but has worsened tonight. Pt reports that he was trying to lift his friend and his pain became much stronger. Pt ambulates independently

## 2020-06-13 ENCOUNTER — Other Ambulatory Visit: Payer: Self-pay

## 2020-06-13 ENCOUNTER — Encounter (HOSPITAL_COMMUNITY): Payer: Self-pay | Admitting: Emergency Medicine

## 2020-06-13 ENCOUNTER — Emergency Department (HOSPITAL_COMMUNITY)
Admission: EM | Admit: 2020-06-13 | Discharge: 2020-06-13 | Disposition: A | Discharge status: Home / Self Care | Payer: Medicaid Other | Attending: Emergency Medicine | Admitting: Emergency Medicine

## 2020-06-13 ENCOUNTER — Emergency Department (HOSPITAL_COMMUNITY): Payer: Medicaid Other

## 2020-06-13 DIAGNOSIS — S93401A Sprain of unspecified ligament of right ankle, initial encounter: Secondary | ICD-10-CM | POA: Insufficient documentation

## 2020-06-13 DIAGNOSIS — F17210 Nicotine dependence, cigarettes, uncomplicated: Secondary | ICD-10-CM | POA: Diagnosis not present

## 2020-06-13 DIAGNOSIS — X58XXXA Exposure to other specified factors, initial encounter: Secondary | ICD-10-CM | POA: Insufficient documentation

## 2020-06-13 DIAGNOSIS — Y9389 Activity, other specified: Secondary | ICD-10-CM | POA: Diagnosis not present

## 2020-06-13 DIAGNOSIS — S99911A Unspecified injury of right ankle, initial encounter: Secondary | ICD-10-CM | POA: Diagnosis present

## 2020-06-13 NOTE — Discharge Instructions (Addendum)
Wear the brace as needed for comfort. Use Advil (ibuprofen) 400 mg, 3 times a day with meals for pain. See the doctor of your choice for problems.

## 2020-06-13 NOTE — ED Triage Notes (Signed)
Patient complaining of right ankle pain. Patient was playing video games and heard his ankle pain. Patient is walking with no problem.

## 2020-06-13 NOTE — ED Provider Notes (Signed)
[facility]-EMERGENCY DEPT Provider Note   CSN: [id number] Arrival date & time: 06/13/20  2102     History Chief Complaint  Patient presents with  . Ankle Pain    [name] is a 25 y.o. male.  HPI Patient noticed right ankle pain, tonight while playing a video game. He denies known trauma. He has a blister on the right foot as well. No prior similar injury or pain. There are no other known modifying factors.    Past Medical History:  Diagnosis Date  . Autism   . Bipolar 1 disorder (HCC)   . Seizures (HCC)    none since 24 years old    Patient Active Problem List   Diagnosis Date Noted  . 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 TOOTH EXTRACTION         History reviewed. No pertinent family history.  Social History   Tobacco Use  . Smoking status: Current Some Day Smoker    Types: Cigarettes  . Smokeless tobacco: Never Used  Vaping Use  . Vaping Use: Former  Substance Use Topics  . Alcohol use: No  . Drug use: No    Comment: "BD"- stated he took one time     Home Medications Prior to Admission medications   Medication Sig Start Date End Date Taking? Authorizing Provider  acetaminophen (TYLENOL 8 HOUR) 650 MG CR tablet Take 1 tablet (650 mg total) by mouth every 8 (eight) hours as needed for pain. 05/31/20   Petrucelli, Samantha R, PA-C  albuterol (VENTOLIN HFA) 108 (90 Base) MCG/ACT inhaler Inhale 1-2 puffs into the lungs every 6 (six) hours as needed for wheezing or shortness of breath. 12/14/19   Placido Sou, PA-C  lidocaine (LIDODERM) 5 % Place 1 patch onto the skin daily as needed. Apply patch to area most significant pain once per day.  Remove and discard patch within 12 hours of application. 05/31/20   Petrucelli, Samantha R, PA-C  naproxen (NAPROSYN) 500 MG tablet Take 1 tablet (500 mg total) by mouth 2 (two) times daily with  a meal. 05/20/20   Caccavale, Sophia, PA-C  diphenhydrAMINE (BENADRYL) 25 MG tablet Take 1 tablet (25 mg total) by mouth every 6 (six) hours as needed. Patient not taking: Reported on 02/12/2020 07/31/19 05/27/20  Margarita Grizzle, MD  famotidine (PEPCID) 20 MG tablet Take 1 tablet (20 mg total) by mouth 2 (two) times daily. Patient not taking: Reported on 02/12/2020 10/02/19 05/27/20  Vanetta Mulders, MD    Allergies    Daytrana [methylphenidate]  Review of Systems   Review of Systems  All other systems reviewed and are negative.   Physical Exam Updated Vital Signs Ht 5\' 9"  (1.753 m)   BMI 20.67 kg/m   Physical Exam Vitals and nursing note reviewed.  Constitutional:      General: He is not in acute distress.    Appearance: He is well-developed and well-nourished. He is not ill-appearing, toxic-appearing or diaphoretic.  HENT:     Head: Normocephalic and atraumatic.     Right Ear: External ear normal.     Left Ear: External ear normal.  Eyes:     Extraocular Movements: EOM normal.     Conjunctiva/sclera: Conjunctivae normal.     Pupils: Pupils are equal, round, and reactive to light.  Neck:     Trachea: Phonation normal.  Cardiovascular:     Rate and  Rhythm: Normal rate.  Pulmonary:     Effort: Pulmonary effort is normal.  Chest:     Chest wall: No bony tenderness.  Abdominal:     General: There is no distension.  Musculoskeletal:     Cervical back: Normal range of motion and neck supple.     Comments: Guards against movement of the right ankle secondary to pain. There is no right ankle deformity. There is mild diffuse right ankle tenderness. He has a small superficial blister of the right foot, mid plantar aspect. No associated bleeding or drainage  Skin:    General: Skin is warm, dry and intact.  Neurological:     Mental Status: He is alert and oriented to person, place, and time.     Cranial Nerves: No cranial nerve deficit.     Sensory: No sensory deficit.     Motor: No  abnormal muscle tone.     Coordination: Coordination normal.  Psychiatric:        Mood and Affect: Mood and affect and mood normal.        Behavior: Behavior normal.        Thought Content: Thought content normal.        Judgment: Judgment normal.     ED Results / Procedures / Treatments   Labs (all labs ordered are listed, but only abnormal results are displayed) Labs Reviewed - No data to display  EKG None  Radiology DG Ankle Complete Right  Result Date: 06/13/2020 CLINICAL DATA:  Ankle pain EXAM: RIGHT ANKLE - COMPLETE 3+ VIEW COMPARISON:  None. FINDINGS: There is no evidence of fracture, dislocation, or joint effusion. There is no evidence of arthropathy or other focal bone abnormality. An os trigonum is present. Soft tissues are unremarkable. IMPRESSION: Negative. Electronically Signed   By: Jonna Clark M.D.   On: 06/13/2020 21:38    Procedures Procedures   Medications Ordered in ED Medications - No data to display  ED Course  I have reviewed the triage vital signs and the nursing notes.  Pertinent labs & imaging results that were available during my care of the patient were reviewed by me and considered in my medical decision making (see chart for details).    MDM Rules/Calculators/A&P                           Patient Vitals for the past 24 hrs:  Height  06/13/20 2109 5\' 9"  (1.753 m)    10:12 PM Reevaluation with update and discussion. After initial assessment and treatment, an updated evaluation reveals no change in status. Findings discussed with patient all questions were answered.   Medical Decision Making:  This patient is presenting for evaluation of right ankle pain, which does not require a range of treatment options, and is not a complaint that involves a high risk of morbidity and mortality. The differential diagnoses include sprain, fracture, nonspecific. I decided to review old records, and in summary patient presenting with pain in  right ankle without known trauma. No associated pain from blister of the same foot.  I did not require additional historical information from anyone.   Radiologic Tests Ordered, included right ankle.  I independently Visualized: Radiographic images, which show no fracture or dislocation   Critical Interventions-clinical evaluation, radiography, observation reassessment. ASO splint ordered  After These Interventions, the Patient was reevaluated and was found stable for discharge with symptomatic treatment  CRITICAL CARE-no Performed by: Mancel Bale  Nursing Notes Reviewed/ Care Coordinated Applicable Imaging Reviewed Interpretation of Laboratory Data incorporated into ED treatment  The patient appears reasonably screened and/or stabilized for discharge and I doubt any other medical condition or other [facility] requiring further screening, evaluation, or treatment in the ED at this time prior to discharge.  Plan: Home Medications-ibuprofen for pain, continue usual medications; Home Treatments-ASO splint for comfort as needed; return here if the recommended treatment, does not improve the symptoms; Recommended follow up-ECP, as needed     Final Clinical Impression(s) / ED Diagnoses Final diagnoses:  Sprain of right ankle, unspecified ligament, initial encounter    Rx / DC Orders ED Discharge Orders    None       Mancel Bale, MD 06/13/20 2215

## 2020-06-14 ENCOUNTER — Other Ambulatory Visit: Payer: Self-pay

## 2020-06-14 ENCOUNTER — Emergency Department (HOSPITAL_COMMUNITY)
Admission: EM | Admit: 2020-06-14 | Discharge: 2020-06-15 | Disposition: A | Discharge status: Home / Self Care | Payer: Medicaid Other | Attending: Emergency Medicine | Admitting: Emergency Medicine

## 2020-06-14 ENCOUNTER — Encounter (HOSPITAL_COMMUNITY): Payer: Self-pay | Admitting: Emergency Medicine

## 2020-06-14 DIAGNOSIS — S99911A Unspecified injury of right ankle, initial encounter: Secondary | ICD-10-CM | POA: Diagnosis present

## 2020-06-14 DIAGNOSIS — F840 Autistic disorder: Secondary | ICD-10-CM | POA: Insufficient documentation

## 2020-06-14 DIAGNOSIS — X58XXXA Exposure to other specified factors, initial encounter: Secondary | ICD-10-CM | POA: Insufficient documentation

## 2020-06-14 DIAGNOSIS — S93401A Sprain of unspecified ligament of right ankle, initial encounter: Secondary | ICD-10-CM | POA: Insufficient documentation

## 2020-06-14 DIAGNOSIS — F17210 Nicotine dependence, cigarettes, uncomplicated: Secondary | ICD-10-CM | POA: Insufficient documentation

## 2020-06-14 NOTE — ED Triage Notes (Signed)
Pt c/o right ankle pain. Seen at [facility] yesterday for same. Brace on ankle, states he's been walking all day.

## 2020-06-15 MED ORDER — NAPROXEN 250 MG PO TABS
500.0000 mg | ORAL_TABLET | Freq: Once | ORAL | Status: AC
  Administered 2020-06-15: 500 mg via ORAL
  Filled 2020-06-15: qty 2

## 2020-06-15 MED ORDER — NAPROXEN 500 MG PO TABS: 500.0000 mg | ORAL_TABLET | Freq: Two times a day (BID) | ORAL | 0 refills | Status: DC

## 2020-06-15 NOTE — ED Provider Notes (Signed)
[name] EMERGENCY DEPARTMENT Provider Note   CSN: [id number] Arrival date & time: 06/14/20  2338     History Chief Complaint  Patient presents with   Ankle Pain    [name] is a 25 y.o. male presents to the Emergency Department complaining of gradual, persistent, progressively worsening right ankle pain.  Patient reports it started hurting last night while he was playing video games.  He was evaluated in the [facility] emergency department yesterday evening but reports he missed the last bus and had to walk home.  He reports after this his ankle hurt worse.  He reports around 4 PM today he took ibuprofen without relief.  He has not attempted any other therapies.  He is wearing the brace he was given.  Movement and palpation make his symptoms worse.  Nothing seems to make them better.  Denies numbness, tingling, weakness, open wounds, fever, chills.  Patient does report he is able to walk.  The history is provided by the patient and medical records. No language interpreter was used.       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  Social History   Tobacco Use   Smoking status: Current Some Day Smoker    Types: Cigarettes   Smokeless tobacco: Never Used  Vaping Use   Vaping Use: Former  Substance Use Topics   Alcohol use: No   Drug use: No    Comment: "BD"- stated he took one time     Home Medications Prior to Admission medications   Medication Sig Start Date End Date Taking? Authorizing Provider  naproxen (NAPROSYN) 500 MG tablet Take 1 tablet (500 mg total) by mouth 2 (two) times daily with a meal.  06/15/20  Yes Shaquinta Peruski, Dahlia Client, PA-C  acetaminophen (TYLENOL 8 HOUR) 650 MG CR tablet Take 1 tablet (650 mg total) by mouth every 8 (eight) hours as needed for pain. 05/31/20   Petrucelli, Samantha R, PA-C  albuterol (VENTOLIN HFA) 108 (90 Base) MCG/ACT inhaler Inhale 1-2 puffs into the lungs every 6 (six) hours as needed for wheezing or shortness of breath. 12/14/19   Placido Sou, PA-C  lidocaine (LIDODERM) 5 % Place 1 patch onto the skin daily as needed. Apply patch to area most significant pain once per day.  Remove and discard patch within 12 hours of application. 05/31/20   Petrucelli, Pleas Koch, PA-C  diphenhydrAMINE (BENADRYL) 25 MG tablet Take 1 tablet (25 mg total) by mouth every 6 (six) hours as needed. Patient not taking: Reported on 02/12/2020 07/31/19 05/27/20  Margarita Grizzle, MD  famotidine (PEPCID) 20 MG tablet Take 1 tablet (20 mg total) by mouth 2 (two) times daily. Patient not taking: Reported on 02/12/2020 10/02/19 05/27/20  Vanetta Mulders, MD    Allergies    Daytrana [methylphenidate]  Review of Systems   Review of Systems  Constitutional: Negative for fatigue and fever.  Musculoskeletal: Positive for arthralgias.  Skin: Negative for wound.    Physical Exam Updated Vital Signs BP 112/68 (BP Location: Left Arm)    Pulse 74  Temp 98.5 F (36.9 C) (Oral)    Resp 20    SpO2 100%   Physical Exam Vitals and nursing note reviewed.  Constitutional:      General: He is not in acute distress.    Appearance: He is well-developed and well-nourished. He is not diaphoretic.  HENT:     Head: Normocephalic and atraumatic.  Eyes:     Conjunctiva/sclera: Conjunctivae normal.  Cardiovascular:     Rate and Rhythm: Normal rate and regular rhythm.     Pulses: Intact distal pulses.     Comments: Capillary refill < 3 sec Pulmonary:     Effort: Pulmonary effort is normal.  Musculoskeletal:        General: Tenderness present. No edema.     Cervical back: Normal range of motion.      Right ankle: No swelling. Tenderness present. Normal range of motion.     Right Achilles Tendon: Normal.     Left ankle: Normal.     Comments: ROM: Full range of motion of the right knee, ankle and foot  Skin:    General: Skin is warm and dry.     Comments: No tenting of the skin No open wounds  Neurological:     Mental Status: He is alert.     Coordination: Coordination normal.     Comments: Sensation intact to light touch throughout the right lower extremity Strength/5 in the right knee, ankle and great toe  Psychiatric:        Mood and Affect: Mood and affect normal.     ED Results / Procedures / Treatments    Radiology DG Ankle Complete Right  Result Date: 06/13/2020 CLINICAL DATA:  Ankle pain EXAM: RIGHT ANKLE - COMPLETE 3+ VIEW COMPARISON:  None. FINDINGS: There is no evidence of fracture, dislocation, or joint effusion. There is no evidence of arthropathy or other focal bone abnormality. An os trigonum is present. Soft tissues are unremarkable. IMPRESSION: Negative. Electronically Signed   By: Jonna Clark M.D.   On: 06/13/2020 21:38    Procedures Procedures   Medications Ordered in ED Medications  naproxen (NAPROSYN) tablet 500 mg (500 mg Oral Given 06/15/20 0040)    ED Course  I have reviewed the triage vital signs and the nursing notes.  Pertinent labs & imaging results that were available during my care of the patient were reviewed by me and considered in my medical decision making (see chart for details).    MDM Rules/Calculators/A&P                           Patient presents with continued right ankle pain.  Evaluated yesterday and diagnosed with sprain.  Records reviewed.  Patient was evaluated for this yesterday.  Plain films were without acute abnormality.  I personally evaluated these films.  Education for patient on healing of ankle sprain.  Education on how to use his brace, need for ice and elevation.  Naproxen prescribed.  Discharge in good  condition.   Final Clinical Impression(s) / ED Diagnoses Final diagnoses:  Sprain of right ankle, unspecified ligament, initial encounter    Rx / DC Orders ED Discharge Orders         Ordered    naproxen (NAPROSYN) 500 MG tablet  2 times daily with meals        06/15/20 0050           Ariannah Arenson, Dahlia Client, PA-C 06/15/20 0055    Wickline,  Dorinda Hill, MD 06/15/20 9485

## 2020-06-15 NOTE — Discharge Instructions (Signed)

## 2020-06-17 ENCOUNTER — Telehealth: Payer: Self-pay | Admitting: *Deleted

## 2020-06-17 ENCOUNTER — Encounter: Payer: Self-pay | Admitting: *Deleted

## 2020-06-17 NOTE — Telephone Encounter (Signed)
Scheduled appt with [facility] June 18, 2020 at 3:45.

## 2020-06-17 NOTE — Congregational Nurse Program (Signed)
  Dept: [phone]   Congregational Nurse Program Note  Date of Encounter: 06/17/2020  Past Medical History: Past Medical History:  Diagnosis Date  . Autism   . Bipolar 1 disorder (HCC)   . Seizures (HCC)    none since 25 years old    Encounter Details:  CNP Questionnaire - 06/17/20 1258      Questionnaire   Do you give verbal consent to treat you today? Yes    Visit Setting Church or Organization    Location Patient Served At [facility]    Patient Status Homeless    Medical Provider No    Insurance Medicaid    Housing/Utilities No permanent housing    Referrals Behavioral/Mental Health Provider;PCP - other provider   [place], [facility]         Client came to [facility] requesting help getting a [facility] appt. Made client an appt March 3rd at 3:45. Gave intake papers for [facility] as client does not have a PCP. Halton Neas W RN CN [phone]

## 2020-06-20 DIAGNOSIS — M545 Low back pain, unspecified: Secondary | ICD-10-CM | POA: Diagnosis not present

## 2020-06-20 DIAGNOSIS — F17210 Nicotine dependence, cigarettes, uncomplicated: Secondary | ICD-10-CM | POA: Insufficient documentation

## 2020-06-20 DIAGNOSIS — E876 Hypokalemia: Secondary | ICD-10-CM | POA: Diagnosis not present

## 2020-06-20 DIAGNOSIS — F840 Autistic disorder: Secondary | ICD-10-CM | POA: Insufficient documentation

## 2020-06-21 ENCOUNTER — Encounter (HOSPITAL_COMMUNITY): Payer: Self-pay | Admitting: Emergency Medicine

## 2020-06-21 ENCOUNTER — Emergency Department (HOSPITAL_COMMUNITY)
Admission: EM | Admit: 2020-06-21 | Discharge: 2020-06-21 | Disposition: A | Discharge status: Home / Self Care | Payer: Medicaid Other | Attending: Emergency Medicine | Admitting: Emergency Medicine

## 2020-06-21 DIAGNOSIS — E876 Hypokalemia: Secondary | ICD-10-CM

## 2020-06-21 DIAGNOSIS — M545 Low back pain, unspecified: Secondary | ICD-10-CM

## 2020-06-21 DIAGNOSIS — G8929 Other chronic pain: Secondary | ICD-10-CM

## 2020-06-21 LAB — I-STAT CHEM 8, ED
BUN: 11 mg/dL (ref 6–20)
Calcium, Ion: 1.21 mmol/L (ref 1.15–1.40)
Chloride: 104 mmol/L (ref 98–111)
Creatinine, Ser: 0.8 mg/dL (ref 0.61–1.24)
Glucose, Bld: 92 mg/dL (ref 70–99)
HCT: 36 % — ABNORMAL LOW (ref 39.0–52.0)
Hemoglobin: 12.2 g/dL — ABNORMAL LOW (ref 13.0–17.0)
Potassium: 3.1 mmol/L — ABNORMAL LOW (ref 3.5–5.1)
Sodium: 142 mmol/L (ref 135–145)
TCO2: 25 mmol/L (ref 22–32)

## 2020-06-21 MED ORDER — LIDOCAINE 5 % EX PTCH
1.0000 | MEDICATED_PATCH | CUTANEOUS | Status: DC
  Administered 2020-06-21: 1 via TRANSDERMAL
  Filled 2020-06-21: qty 1

## 2020-06-21 MED ORDER — POTASSIUM CHLORIDE CRYS ER 20 MEQ PO TBCR
40.0000 meq | EXTENDED_RELEASE_TABLET | Freq: Once | ORAL | Status: AC
  Administered 2020-06-21: 40 meq via ORAL
  Filled 2020-06-21: qty 2

## 2020-06-21 NOTE — ED Triage Notes (Signed)
Pt c/o low back pain, chronic. Pt would like K+ level checked to make sure it is WNL

## 2020-06-21 NOTE — Discharge Instructions (Signed)
Alternate ice and heat to areas of injury 3-4 times per day to limit inflammation and spasm.  Avoid strenuous activity and heavy lifting.  We recommend consistent use of ibuprofen for pain.  Use as instructed on the box/bottle.  Follow-up with a primary care doctor to ensure resolution of symptoms.  Return to the ED for any new or concerning symptoms.

## 2020-06-21 NOTE — ED Provider Notes (Signed)
[name] EMERGENCY DEPARTMENT Provider Note   CSN: [id number] Arrival date & time: 06/20/20  2248     History Chief Complaint  Patient presents with  . Back Pain    [name] is a 25 y.o. male.  25 year old male with hx of bipolar 1 disorder, autism, and seizure presents to the emergency department for evaluation of low back pain x2 days.  States that his pain has been constant and is similar to exacerbations of back pain previously.  He has not taken any medications for his symptoms.  Denies any recent trauma, fall, strenuous activity or heavy lifting.  He has not had any shortness of breath, urinary symptoms, extremity numbness or paresthesias, extremity weakness, bowel or bladder incontinence.        Past Medical History:  Diagnosis Date  . Autism   . Bipolar 1 disorder (HCC)   . Seizures (HCC)    none since 25 years old    Patient Active Problem List   Diagnosis Date Noted  . 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 TOOTH EXTRACTION         No family history on file.  Social History   Tobacco Use  . Smoking status: Current Some Day Smoker    Types: Cigarettes  . Smokeless tobacco: Never Used  Vaping Use  . Vaping Use: Former  Substance Use Topics  . Alcohol use: No  . Drug use: No    Comment: "BD"- stated he took one time     Home Medications Prior to Admission medications   Medication Sig Start Date End Date Taking? Authorizing Provider  acetaminophen (TYLENOL 8 HOUR) 650 MG CR tablet Take 1 tablet (650 mg total) by mouth every 8 (eight) hours as needed for pain. 05/31/20   Petrucelli, Samantha R, PA-C  albuterol (VENTOLIN HFA) 108 (90 Base) MCG/ACT inhaler Inhale 1-2 puffs into the lungs every 6 (six) hours as needed for wheezing or shortness of breath. 12/14/19   Placido Sou, PA-C  lidocaine (LIDODERM) 5 % Place 1 patch  onto the skin daily as needed. Apply patch to area most significant pain once per day.  Remove and discard patch within 12 hours of application. 05/31/20   Petrucelli, Samantha R, PA-C  naproxen (NAPROSYN) 500 MG tablet Take 1 tablet (500 mg total) by mouth 2 (two) times daily with a meal. 06/15/20   Muthersbaugh, Dahlia Client, PA-C  diphenhydrAMINE (BENADRYL) 25 MG tablet Take 1 tablet (25 mg total) by mouth every 6 (six) hours as needed. Patient not taking: Reported on 02/12/2020 07/31/19 05/27/20  Margarita Grizzle, MD  famotidine (PEPCID) 20 MG tablet Take 1 tablet (20 mg total) by mouth 2 (two) times daily. Patient not taking: Reported on 02/12/2020 10/02/19 05/27/20  Vanetta Mulders, MD    Allergies    Daytrana [methylphenidate]  Review of Systems   Review of Systems  Ten systems reviewed and are negative for acute change, except as noted in the HPI.    Physical Exam Updated Vital Signs BP (!) 98/58 (BP Location: Right Arm)   Pulse 70   Temp 99.2 F (37.3 C)   Resp 14   SpO2 100%   Physical Exam Vitals and nursing note reviewed.  Constitutional:      General: He is not in acute distress.    Appearance: He is well-developed and well-nourished. He is not diaphoretic.     Comments:  Nontoxic appearing, but unbathed.  HENT:     Head: Normocephalic and atraumatic.  Eyes:     General: No scleral icterus.    Extraocular Movements: EOM normal.     Conjunctiva/sclera: Conjunctivae normal.  Pulmonary:     Effort: Pulmonary effort is normal. No respiratory distress.     Comments: Respirations even and unlabored Musculoskeletal:        General: Normal range of motion.     Cervical back: Normal range of motion.     Comments: No reproducible tenderness to palpation to the thoracic or lumbosacral midline.  No bony deformities, step-offs, crepitus.  No appreciable tenderness to the bilateral thoracic or lumbosacral paraspinal muscles.  Skin:    General: Skin is warm and dry.     Coloration: Skin is  not pale.     Findings: No erythema.     Comments: Tinea versicolor  Neurological:     Mental Status: He is alert and oriented to person, place, and time.     Coordination: Coordination normal.  Psychiatric:        Mood and Affect: Mood and affect normal.        Behavior: Behavior normal.     ED Results / Procedures / Treatments   Labs (all labs ordered are listed, but only abnormal results are displayed) Labs Reviewed  I-STAT CHEM 8, ED - Abnormal; Notable for the following components:      Result Value   Potassium 3.1 (*)    Hemoglobin 12.2 (*)    HCT 36.0 (*)    All other components within normal limits    EKG None  Radiology No results found.  Procedures Procedures   Medications Ordered in ED Medications  lidocaine (LIDODERM) 5 % 1 patch (has no administration in time range)  potassium chloride SA (KLOR-CON) CR tablet 40 mEq (has no administration in time range)    ED Course  I have reviewed the triage vital signs and the nursing notes.  Pertinent labs & imaging results that were available during my care of the patient were reviewed by me and considered in my medical decision making (see chart for details).    MDM Rules/Calculators/A&P                          Patient presenting with acute exacerbation of chronic back pain. No history of recent or remote trauma. Neurovascularly intact on exam. No loss of bowel or bladder control.  No concern for cauda equina.  No fever, h/o cancer, IVDU.  RICE protocol and pain medicine indicated and discussed with patient.  Return precautions discussed and provided. Patient discharged in stable condition with no unaddressed concerns.   Final Clinical Impression(s) / ED Diagnoses Final diagnoses:  Acute exacerbation of chronic low back pain  Hypokalemia    Rx / DC Orders ED Discharge Orders    None       Antony Madura, PA-C 06/21/20 0600    Gilda Crease, MD 06/21/20 [phone]

## 2020-07-28 ENCOUNTER — Encounter (HOSPITAL_COMMUNITY): Payer: Self-pay

## 2020-07-28 ENCOUNTER — Other Ambulatory Visit: Payer: Self-pay

## 2020-07-28 ENCOUNTER — Emergency Department (HOSPITAL_COMMUNITY)
Admission: EM | Admit: 2020-07-28 | Discharge: 2020-07-28 | Disposition: A | Discharge status: Home / Self Care | Payer: Medicaid Other | Attending: Emergency Medicine | Admitting: Emergency Medicine

## 2020-07-28 DIAGNOSIS — S0990XA Unspecified injury of head, initial encounter: Secondary | ICD-10-CM

## 2020-07-28 DIAGNOSIS — F840 Autistic disorder: Secondary | ICD-10-CM | POA: Diagnosis not present

## 2020-07-28 DIAGNOSIS — S0083XA Contusion of other part of head, initial encounter: Secondary | ICD-10-CM | POA: Insufficient documentation

## 2020-07-28 DIAGNOSIS — F17210 Nicotine dependence, cigarettes, uncomplicated: Secondary | ICD-10-CM | POA: Insufficient documentation

## 2020-07-28 HISTORY — DX: Schizophrenia, unspecified: F20.9

## 2020-07-28 HISTORY — DX: Attention-deficit hyperactivity disorder, unspecified type: F90.9

## 2020-07-28 HISTORY — DX: Oppositional defiant disorder: F91.3

## 2020-07-28 MED ORDER — ACETAMINOPHEN 325 MG PO TABS
650.0000 mg | ORAL_TABLET | Freq: Once | ORAL | Status: AC
  Administered 2020-07-28: 650 mg via ORAL
  Filled 2020-07-28: qty 2
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
## 2020-11-17 ENCOUNTER — Emergency Department (HOSPITAL_COMMUNITY)
Admission: EM | Admit: 2020-11-17 | Discharge: 2020-11-17 | Disposition: A | Discharge status: Home / Self Care | Payer: Medicaid Other | Attending: Emergency Medicine | Admitting: Emergency Medicine

## 2020-11-17 DIAGNOSIS — L42 Pityriasis rosea: Secondary | ICD-10-CM | POA: Insufficient documentation

## 2020-11-17 DIAGNOSIS — G8929 Other chronic pain: Secondary | ICD-10-CM | POA: Insufficient documentation

## 2020-11-17 DIAGNOSIS — F840 Autistic disorder: Secondary | ICD-10-CM | POA: Insufficient documentation

## 2020-11-17 DIAGNOSIS — M25562 Pain in left knee: Secondary | ICD-10-CM | POA: Insufficient documentation

## 2020-11-17 DIAGNOSIS — F17210 Nicotine dependence, cigarettes, uncomplicated: Secondary | ICD-10-CM | POA: Diagnosis not present

## 2020-11-17 MED ORDER — MELOXICAM 7.5 MG PO TABS: 7.5000 mg | ORAL_TABLET | Freq: Every day | ORAL | 0 refills | Status: DC

## 2020-11-17 MED ORDER — TRIAMCINOLONE ACETONIDE 0.025 % EX OINT: 1.0000 "application " | TOPICAL_OINTMENT | Freq: Two times a day (BID) | CUTANEOUS | 1 refills | Status: DC

## 2020-11-17 NOTE — ED Provider Notes (Signed)
[name] EMERGENCY DEPARTMENT Provider Note   CSN: [id number] Arrival date & time: 11/17/20  0151     History Chief Complaint  Patient presents with   Knee Pain   Rash    [name] is a 25 y.o. male presents to the emergency department with several complaints.  Initially he complains of left knee pain.  Reports that he injured it approximately a month ago and has had pain since that time.  States that the initial injury for his knee was 3 years ago and has given him problems on and off since that time.  Patient reports no acute injury today but is frustrated with it hurting.  Walking on it makes it worse.  Nothing really seems to make it better.  Patient denies swelling, redness, numbness, tingling or weakness.  Patient additionally complaining of rash to his torso.  Reports he noticed it 1 month ago.  States that it occasionally itches but not on a regular basis.  No treatments prior to arrival.  Denies known allergies, changes in environment including detergent, lotions, etc.  Denies nausea, vomiting, facial swelling, throat swelling, wheezing or other signs of allergic reaction.  The history is provided by the patient and medical records. No language interpreter was used.      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  Social History   Tobacco Use   Smoking status: Some Days    Types: Cigarettes   Smokeless tobacco: Never  Vaping Use   Vaping Use: Former  Substance Use Topics   Alcohol use: No   Drug use: No    Comment: "BD"- stated he took  one time     Home Medications Prior to Admission medications   Medication Sig Start Date End Date Taking? Authorizing Provider  meloxicam (MOBIC) 7.5 MG tablet Take 1 tablet (7.5 mg total) by mouth daily. With food 11/17/20  Yes Duchess Armendarez, Dahlia Client, PA-C  triamcinolone (KENALOG) 0.025 % ointment Apply 1 application topically 2 (two) times daily. Do not apply to face 11/17/20  Yes Elica Almas, Dahlia Client, PA-C  acetaminophen (TYLENOL 8 HOUR) 650 MG CR tablet Take 1 tablet (650 mg total) by mouth every 8 (eight) hours as needed for pain. 05/31/20   Petrucelli, Samantha R, PA-C  albuterol (VENTOLIN HFA) 108 (90 Base) MCG/ACT inhaler Inhale 1-2 puffs into the lungs every 6 (six) hours as needed for wheezing or shortness of breath. 12/14/19   Placido Sou, PA-C  lidocaine (LIDODERM) 5 % Place 1 patch onto the skin daily as needed. Apply patch to area most significant pain once per day.  Remove and discard patch within 12 hours of application. 05/31/20   Petrucelli, Pleas Koch, PA-C  diphenhydrAMINE (BENADRYL) 25 MG tablet Take 1 tablet (25 mg total) by mouth every 6 (six) hours as needed. Patient not taking: Reported on 02/12/2020 07/31/19 05/27/20  Margarita Grizzle, MD  famotidine (PEPCID) 20 MG tablet Take 1 tablet (20 mg total) by mouth 2 (two) times  daily. Patient not taking: Reported on 02/12/2020 10/02/19 05/27/20  Vanetta Mulders, MD    Allergies    Daytrana [methylphenidate]  Review of Systems   Review of Systems  Constitutional:  Negative for appetite change, diaphoresis, fatigue, fever and unexpected weight change.  HENT:  Negative for mouth sores.   Eyes:  Negative for visual disturbance.  Respiratory:  Negative for cough, chest tightness, shortness of breath and wheezing.   Cardiovascular:  Negative for chest pain.  Gastrointestinal:  Negative for abdominal pain, constipation, diarrhea, nausea and vomiting.  Endocrine: Negative for polydipsia, polyphagia and polyuria.  Genitourinary:  Negative  for dysuria, frequency, hematuria and urgency.  Musculoskeletal:  Positive for arthralgias. Negative for back pain, joint swelling and neck stiffness.  Skin:  Positive for rash.  Allergic/Immunologic: Negative for immunocompromised state.  Neurological:  Negative for syncope, light-headedness and headaches.  Hematological:  Does not bruise/bleed easily.  Psychiatric/Behavioral:  Negative for sleep disturbance. The patient is not nervous/anxious.    Physical Exam Updated Vital Signs BP 116/76 (BP Location: Left Arm)   Pulse 90   Temp 98.4 F (36.9 C)   Resp 16   SpO2 100%   Physical Exam Vitals and nursing note reviewed.  Constitutional:      General: He is not in acute distress.    Appearance: He is well-developed. He is not ill-appearing.  HENT:     Head: Normocephalic.  Eyes:     General: No scleral icterus.    Conjunctiva/sclera: Conjunctivae normal.  Cardiovascular:     Rate and Rhythm: Normal rate.  Pulmonary:     Effort: Pulmonary effort is normal.  Abdominal:     General: There is no distension.  Musculoskeletal:        General: Normal range of motion.     Cervical back: Normal range of motion.     Right hip: Normal.     Left hip: Normal.     Right knee: Normal.     Left knee: No swelling, deformity, effusion, erythema or ecchymosis. Normal range of motion. Tenderness present. Normal alignment and normal patellar mobility.     Comments: Ambulates unassisted with steady gait  Skin:    General: Skin is warm and dry.     Findings: Rash present.     Comments: Macular rash along the chest and torso  Neurological:     Mental Status: He is alert.  Psychiatric:        Mood and Affect: Mood normal.       ED Results / Procedures / Treatments   Labs (all labs ordered are listed, but only abnormal results are displayed) Labs Reviewed - No data to display  EKG None  Radiology No results found.  Procedures Procedures   Medications Ordered in  ED Medications - No data to display  ED Course  I have reviewed the triage vital signs and the nursing notes.  Pertinent labs & imaging results that were available during my care of the patient were reviewed by me and considered in my medical decision making (see chart for details).    MDM Rules/Calculators/A&P                           Patient with several complaints.  Left knee pain appears to be chronic in nature.  He has full range of motion, no effusion and ambulates without difficulty.  Recommend Mobic for ongoing discomfort.  No new trauma to suggest need for  imaging tonight.  Patient also with rash x1 month.  Clinically appears to be pityriasis rosea.  No evidence of secondary infection.  No clinical evidence of allergic reaction or anaphylaxis.  Will give triamcinolone.   Final Clinical Impression(s) / ED Diagnoses Final diagnoses:  Chronic pain of left knee  Pityriasis rosea    Rx / DC Orders ED Discharge Orders          Ordered    triamcinolone (KENALOG) 0.025 % ointment  2 times daily        11/17/20 0240    meloxicam (MOBIC) 7.5 MG tablet  Daily        11/17/20 0240             Nixie Laube, Dahlia Client, PA-C 11/17/20 0250    Gilda Crease, MD 11/17/20 [phone]

## 2020-11-17 NOTE — ED Triage Notes (Signed)
Pt c/o L knee pain after "it went out" & pt "put it back in place." Pt also c/o rash to trunk, bilateral arms. Pt ambulatory to triage, able to move L knee independently, NAD noted in triage. Pt denies SHOB, CP, NVD, fevers, chills

## 2020-11-17 NOTE — Discharge Instructions (Addendum)
1. Medications: Triamcinolone, Mobic, usual home medications 2. Treatment: rest, drink plenty of fluids,  3. Follow Up: Please followup with your primary doctor in 2-3 days for discussion of your diagnoses and further evaluation after today's visit; if you do not have a primary care doctor use the resource guide provided to find one; Please return to the ER for new or worsening symptoms   Patient education: Pityriasis rosea (The Basics) Written by the doctors and editors at [employer]ToDate Please read the Disclaimer at the end of this page. What is pityriasis rosea? Pityriasis rosea is a harmless skin rash that causes small, itchy spots on the belly, back, chest, arms, and legs. The rash usually lasts about 4 to 6 weeks,but in some people, it can last for months. Pityriasis rosea is most common in older children and young adults. What causes pityriasis rosea? The cause is not known. But it does not seem to be easily spread from person toperson. What are the symptoms of pityriasis rosea? In many people, the rash starts with one round or oval patch. This spot is about the size of a half dollar but might be larger. A day or two later, many smaller spots about the size of a dime appear. But not everyone gets the largespot before the rest of the rash. If you have light skin, the spots are usually pink or salmon-colored. If you have dark skin, the spots can be a red-brown color or darker than your skin (picture 1 and picture 2). The spots might be: ?On the belly, back, chest, arms, and legs ?Spread out in a "fir tree" or "Christmas tree" pattern on the back ?Itchy ?A little scaly  In children, the spots sometimes happen on the face and scalp. Is there a test for pityriasis rosea? Maybe. Your doctor or nurse will often be able to tell if you have it by learning about your symptoms and doing an exam. But they might gently scrape the rash or do a different test to get a sample of your skin. Tests on the skin  sample can help the doctor tell if you have pityriasis rosea or a differentdisease. Is there anything I can do on my own to feel better? Yes. You can: ?Take a special kind of bath called an oatmeal bath. Use lukewarm, not hot water. ?Use unscented moisturizing lotion or cream on your skin. ?Try to keep your body cool.  How is pityriasis rosea treated? Most people do not need any treatment. If the symptoms bother you, your doctor might prescribe creams or ointments to help with itching. In rare cases, doctors prescribe other medicines or a special type of treatment that useslights, called "phototherapy." All topics are updated as new evidence becomes available and our peer review process is complete. This topic retrieved from Up[employer]Date on: Nov 17, 2020. This generalized information is a limited summary of diagnosis, treatment, and/or medication information. It is not meant to be comprehensive and should be used as a tool to help the user understand and/or assess potential diagnostic and treatment options. It does NOT include all information about conditions, treatments, medications, side effects, or risks that may apply to a specific patient. It is not intended to be medical advice or a substitute for the medical advice, diagnosis, or treatment of a health care provider based on the health care provider's examination and assessment of a patient's specific and unique circumstances. Patients must speak with a health care provider for complete information about their health, medical questions, and  treatment options, including any risks or benefits regarding use of medications. This information does not endorse any treatments or medications as safe, effective, or approved for treating a specific patient. [employer], Inc[employer] and its affiliates disclaim any warranty or liability relating to this information or the use thereof. The use of this information is governed by the Terms of Use, available at  https://www.wolterskluwer.com/en/know/clinical-effectiveness-terms 2022 [employer], Inc. and its affiliates and/or licensors. All rights reserved. Topic 17226 Version 8.0 GRAPHICS Pityriasis rosea  Pityriasis rosea causes itchy spots on the skin. In people with light skin, the spots are usually pink or salmon-colored. The rash sometimes starts with onelarger patch. Reproduced with permission from: [employer]. Copyright [employer]. All rights reserved. Graphic [phone] Version 3.0  Pityriasis rosea  In people with dark skin, the pityriasis rosea rash can be a red-brown color ordarker than the skin.

## 2020-12-16 ENCOUNTER — Emergency Department (HOSPITAL_COMMUNITY)
Admission: EM | Admit: 2020-12-16 | Discharge: 2020-12-16 | Disposition: A | Discharge status: Home / Self Care | Payer: Medicaid Other | Attending: Physician Assistant | Admitting: Physician Assistant

## 2020-12-16 DIAGNOSIS — L42 Pityriasis rosea: Secondary | ICD-10-CM | POA: Insufficient documentation

## 2020-12-16 DIAGNOSIS — F17210 Nicotine dependence, cigarettes, uncomplicated: Secondary | ICD-10-CM | POA: Insufficient documentation

## 2020-12-16 DIAGNOSIS — G8929 Other chronic pain: Secondary | ICD-10-CM | POA: Insufficient documentation

## 2020-12-16 DIAGNOSIS — F840 Autistic disorder: Secondary | ICD-10-CM | POA: Insufficient documentation

## 2020-12-16 DIAGNOSIS — M25562 Pain in left knee: Secondary | ICD-10-CM | POA: Insufficient documentation

## 2020-12-16 MED ORDER — IBUPROFEN 400 MG PO TABS
400.0000 mg | ORAL_TABLET | Freq: Once | ORAL | Status: AC
  Administered 2020-12-16: 400 mg via ORAL
  Filled 2020-12-16: qty 1

## 2020-12-16 MED ORDER — MELOXICAM 15 MG PO TABS: 15.0000 mg | ORAL_TABLET | Freq: Every day | ORAL | 0 refills | Status: AC

## 2020-12-16 MED ORDER — TRIAMCINOLONE ACETONIDE 0.1 % EX CREA: 1.0000 "application " | TOPICAL_CREAM | Freq: Two times a day (BID) | CUTANEOUS | 0 refills | Status: DC

## 2020-12-16 MED ORDER — IBUPROFEN 600 MG PO TABS: 600.0000 mg | ORAL_TABLET | Freq: Four times a day (QID) | ORAL | 0 refills | Status: DC | PRN

## 2020-12-16 NOTE — ED Provider Notes (Signed)
[name] EMERGENCY DEPARTMENT Provider Note   CSN: [id number] Arrival date & time: 12/16/20  0044     History Chief Complaint  Patient presents with   Knee Pain    L    [name] is a 25 y.o. male presents with complaints of ongoing chronic left knee pain for the last 2 years.  Denies new injuries or falls.  Reports that he is now experiencing some low back pain due to "limping."  He denies numbness, tingling or weakness in his legs.  Denies loss of bowel or bladder control.  Per EMS, pt ambulatory without difficulty to their unit for transport.  Reports the Rx for meloxicam did help, but he is out of it.    Pt also reports his rash is not improved because he has not yet filled his prescription. He requests a new paper prescription for this.  The history is provided by the patient and medical records. No language interpreter was used.      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  Social History   Tobacco Use   Smoking status: Some Days    Types: Cigarettes   Smokeless tobacco: Never  Vaping Use   Vaping Use: Former  Substance Use Topics   Alcohol use: No   Drug use: No    Comment: "BD"- stated he took one time     Home Medications Prior to Admission medications   Medication Sig Start Date End Date Taking? Authorizing Provider  ibuprofen (ADVIL) 600 MG tablet Take 1 tablet (600 mg total) by mouth every 6 (six) hours as needed. 12/16/20  Yes Sobia Karger, Dahlia Client, PA-C  triamcinolone cream (KENALOG) 0.1 % Apply 1 application topically 2  (two) times daily. 12/16/20  Yes Angelos Wasco, Dahlia Client, PA-C  acetaminophen (TYLENOL 8 HOUR) 650 MG CR tablet Take 1 tablet (650 mg total) by mouth every 8 (eight) hours as needed for pain. 05/31/20   Petrucelli, Samantha R, PA-C  albuterol (VENTOLIN HFA) 108 (90 Base) MCG/ACT inhaler Inhale 1-2 puffs into the lungs every 6 (six) hours as needed for wheezing or shortness of breath. 12/14/19   Placido Sou, PA-C  lidocaine (LIDODERM) 5 % Place 1 patch onto the skin daily as needed. Apply patch to area most significant pain once per day.  Remove and discard patch within 12 hours of application. 05/31/20   Petrucelli, Pleas Koch, PA-C  meloxicam (MOBIC) 15 MG tablet Take 1 tablet (15 mg total) by mouth daily. With food 12/16/20 01/15/21  Ridhaan Dreibelbis, Dahlia Client, PA-C  diphenhydrAMINE (BENADRYL) 25 MG tablet Take 1 tablet (25 mg total) by mouth every 6 (six) hours as needed. Patient not taking: Reported on 02/12/2020 07/31/19 05/27/20  Margarita Grizzle, MD  famotidine (PEPCID) 20 MG tablet Take 1 tablet (20 mg total) by mouth 2 (two) times daily. Patient not taking: Reported on 02/12/2020 10/02/19 05/27/20  Vanetta Mulders, MD    Allergies    Daytrana [methylphenidate]  Review of Systems  Review of Systems  Constitutional:  Negative for chills and fever.  Gastrointestinal:  Negative for nausea and vomiting.  Musculoskeletal:  Positive for arthralgias and back pain. Negative for joint swelling, neck pain and neck stiffness.  Skin:  Positive for rash. Negative for wound.  Neurological:  Negative for numbness.  Hematological:  Does not bruise/bleed easily.  Psychiatric/Behavioral:  The patient is not nervous/anxious.   All other systems reviewed and are negative.  Physical Exam Updated Vital Signs BP 105/75 (BP Location: Left Arm)   Pulse (!) 115   Temp 99.4 F (37.4 C) (Oral)   Resp 17   SpO2 91%   Physical Exam Vitals and nursing note reviewed.  Constitutional:      General: He is not in acute  distress.    Appearance: He is well-developed. He is not ill-appearing.  HENT:     Head: Normocephalic.  Eyes:     General: No scleral icterus.    Conjunctiva/sclera: Conjunctivae normal.  Cardiovascular:     Rate and Rhythm: Normal rate.  Pulmonary:     Effort: Pulmonary effort is normal.  Abdominal:     General: There is no distension.  Musculoskeletal:        General: Normal range of motion.     Cervical back: Normal range of motion.     Comments: Full ROM of the left knee, no swelling ecchymosis or effusion.  Pt able to weight bear without difficulty, but reports it is painful.  Skin:    General: Skin is warm and dry.  Neurological:     Mental Status: He is alert.  Psychiatric:        Mood and Affect: Mood normal.    ED Results / Procedures / Treatments    Procedures Procedures   Medications Ordered in ED Medications  ibuprofen (ADVIL) tablet 400 mg (400 mg Oral Given 12/16/20 0105)    ED Course  I have reviewed the triage vital signs and the nursing notes.  Pertinent labs & imaging results that were available during my care of the patient were reviewed by me and considered in my medical decision making (see chart for details).    MDM Rules/Calculators/A&P                           Pt with chronic knee pain.  No new injuries.  Rx refill for meloxicam and triamcinolone written.    Final Clinical Impression(s) / ED Diagnoses Final diagnoses:  Chronic pain of left knee  Pityriasis rosea    Rx / DC Orders ED Discharge Orders          Ordered    triamcinolone cream (KENALOG) 0.1 %  2 times daily        12/16/20 0059    meloxicam (MOBIC) 15 MG tablet  Daily        12/16/20 0059    ibuprofen (ADVIL) 600 MG tablet  Every 6 hours PRN        12/16/20 0100             Lundyn Coste, Boyd Kerbs 12/16/20 0105    Dione Booze, MD 12/16/20 [phone]

## 2020-12-16 NOTE — ED Triage Notes (Signed)
Pt c/o L knee pain, able to bear weight. Seen multiple times for same

## 2020-12-16 NOTE — Discharge Instructions (Addendum)
1. Medications: alternate take Meloxicam as prescribed; usual home medications 2. Treatment: rest, ice, elevate and use brace, drink plenty of fluids, gentle stretching 3. Follow Up: Please followup with orthopedics as directed or your PCP in 1 week if no improvement for discussion of your diagnoses and further evaluation after today's visit; if you do not have a primary care doctor use the resource guide provided to find one; Please return to the ER for worsening symptoms or other concerns

## 2020-12-18 ENCOUNTER — Other Ambulatory Visit: Payer: Self-pay

## 2020-12-18 ENCOUNTER — Emergency Department (HOSPITAL_COMMUNITY)
Admission: EM | Admit: 2020-12-18 | Discharge: 2020-12-18 | Disposition: A | Discharge status: Home / Self Care | Payer: Medicaid Other | Attending: Emergency Medicine | Admitting: Emergency Medicine

## 2020-12-18 ENCOUNTER — Emergency Department (HOSPITAL_COMMUNITY): Payer: Medicaid Other

## 2020-12-18 ENCOUNTER — Encounter (HOSPITAL_COMMUNITY): Payer: Self-pay

## 2020-12-18 DIAGNOSIS — Z20822 Contact with and (suspected) exposure to covid-19: Secondary | ICD-10-CM | POA: Diagnosis not present

## 2020-12-18 DIAGNOSIS — F17210 Nicotine dependence, cigarettes, uncomplicated: Secondary | ICD-10-CM | POA: Insufficient documentation

## 2020-12-18 DIAGNOSIS — J069 Acute upper respiratory infection, unspecified: Secondary | ICD-10-CM

## 2020-12-18 DIAGNOSIS — R059 Cough, unspecified: Secondary | ICD-10-CM | POA: Diagnosis present

## 2020-12-18 DIAGNOSIS — J988 Other specified respiratory disorders: Secondary | ICD-10-CM | POA: Diagnosis not present

## 2020-12-18 LAB — RESP PANEL BY RT-PCR (FLU A&B, COVID) ARPGX2
Influenza A by PCR: NEGATIVE
Influenza B by PCR: NEGATIVE
SARS Coronavirus 2 by RT PCR: NEGATIVE

## 2020-12-18 MED ORDER — ACETAMINOPHEN 325 MG PO TABS
650.0000 mg | ORAL_TABLET | Freq: Once | ORAL | Status: AC
  Administered 2020-12-18: 650 mg via ORAL
  Filled 2020-12-18: qty 2

## 2020-12-18 MED ORDER — IBUPROFEN 400 MG PO TABS
600.0000 mg | ORAL_TABLET | Freq: Once | ORAL | Status: AC
  Administered 2020-12-18: 600 mg via ORAL
  Filled 2020-12-18: qty 1

## 2020-12-18 NOTE — ED Provider Notes (Signed)
Emergency Medicine Provider Triage Evaluation Note  [name] , a 25 y.o. male with history of autism and bipolar disorder was evaluated in triage.  Pt complains of cough and body aches x 2 days. Patient was brought via EMS from [facility] parking lot. He has had 3 COVID vaccines. Has been using cough drops, no other medications. His cough is productive with white sputum, no blood.  Review of Systems  Positive: Cough, congestion, body aches, SOB, fever, nausea Negative: Abdominal pain, vomiting, dysuria, diarrhea, constipation  Physical Exam  BP 115/81 (BP Location: Right Arm)   Pulse 89   Temp 98.6 F (37 C)   Resp 16   Ht 5\' 9"  (1.753 m)   Wt 65.8 kg   SpO2 99%   BMI 21.41 kg/m  Gen:   Awake, no distress   Resp:  Normal effort  MSK:   Moves extremities without difficulty  Other:    Medical Decision Making  Medically screening exam initiated at 4:02 PM.  Appropriate orders placed.  [name] was informed that the remainder of the evaluation will be completed by another provider, this initial triage assessment does not replace that evaluation, and the importance of remaining in the ED until their evaluation is complete.     [name] 12/18/20 1607    02/17/21, MD 12/18/20 02/17/21

## 2020-12-18 NOTE — ED Triage Notes (Signed)
Pt bib ems from [employer] parking lot. Pt reports cough, generalized body aches and sob for the past 2 days. Pt states he has had 3 COVID vaccines

## 2020-12-18 NOTE — Discharge Instructions (Addendum)
If you develop high fever, severe cough or cough with blood, trouble breathing, severe headache, neck pain/stiffness, vomiting, or any other new/concerning symptoms then return to the ER for evaluation  

## 2020-12-18 NOTE — ED Provider Notes (Signed)
[name] EMERGENCY DEPARTMENT Provider Note   CSN: [id number] Arrival date & time: 12/18/20  1550     History Chief Complaint  Patient presents with   Generalized Body Aches   Shortness of Breath   Cough    [name] is a 25 y.o. male.  HPI 25 year old male presents with cough.  He has been feeling poorly for a couple days which includes nasal congestion, sore throat, cough with white sputum.  He is also felt hot and cold.  Has somewhat of a headache.  He has not been vomiting but has felt some nausea.  Sometimes feels short of breath.  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  Social History   Tobacco Use   Smoking status: Some Days    Types: Cigarettes   Smokeless tobacco: Never  Vaping Use   Vaping Use: Former  Substance Use Topics   Alcohol use: No   Drug use: No    Comment: "BD"- stated he took one time     Home Medications Prior to Admission medications   Medication Sig Start Date End Date Taking? Authorizing Provider  acetaminophen (TYLENOL 8 HOUR) 650 MG CR tablet Take 1 tablet (650 mg total) by mouth every 8 (eight) hours as needed for pain. 05/31/20   Petrucelli, Samantha R, PA-C  albuterol (VENTOLIN HFA) 108 (90 Base) MCG/ACT inhaler Inhale 1-2 puffs into the lungs every 6 (six) hours as needed for wheezing or shortness of breath. 12/14/19   Placido Sou, PA-C  lidocaine (LIDODERM) 5 % Place 1 patch onto the skin daily as needed. Apply patch to area most significant pain once per day.  Remove and discard patch within 12  hours of application. 05/31/20   Petrucelli, Pleas Koch, PA-C  meloxicam (MOBIC) 15 MG tablet Take 1 tablet (15 mg total) by mouth daily. With food 12/16/20 01/15/21  Muthersbaugh, Dahlia Client, PA-C  triamcinolone cream (KENALOG) 0.1 % Apply 1 application topically 2 (two) times daily. 12/16/20   Muthersbaugh, Dahlia Client, PA-C  diphenhydrAMINE (BENADRYL) 25 MG tablet Take 1 tablet (25 mg total) by mouth every 6 (six) hours as needed. Patient not taking: Reported on 02/12/2020 07/31/19 05/27/20  Margarita Grizzle, MD  famotidine (PEPCID) 20 MG tablet Take 1 tablet (20 mg total) by mouth 2 (two) times daily. Patient not taking: Reported on 02/12/2020 10/02/19 05/27/20  Vanetta Mulders, MD    Allergies    Daytrana [methylphenidate]  Review of Systems   Review of Systems  Constitutional:  Positive for chills and fever.  HENT:  Positive for congestion and sore throat.   Respiratory:  Positive for cough and shortness of breath.   Gastrointestinal:  Negative for vomiting.  All other systems reviewed and are negative.  Physical Exam Updated Vital Signs BP 115/81 (BP Location: Right Arm)   Pulse 89   Temp 98.6 F (37 C)   Resp 16   Ht 5\' 9"  (1.753 m)   Wt 65.8 kg   SpO2  99%   BMI 21.41 kg/m   Physical Exam Vitals and nursing note reviewed.  Constitutional:      Appearance: He is well-developed.  HENT:     Head: Normocephalic and atraumatic.     Right Ear: External ear normal.     Left Ear: External ear normal.     Nose: Nose normal.     Mouth/Throat:     Pharynx: Oropharynx is clear. No oropharyngeal exudate.  Eyes:     General:        Right eye: No discharge.        Left eye: No discharge.  Cardiovascular:     Rate and Rhythm: Normal rate and regular rhythm.     Heart sounds: Normal heart sounds.  Pulmonary:     Effort: Pulmonary effort is normal.     Breath sounds: Normal breath sounds. No wheezing or rales.  Abdominal:     Palpations: Abdomen is soft.     Tenderness: There is no abdominal  tenderness.  Musculoskeletal:     Cervical back: Neck supple.  Skin:    General: Skin is warm and dry.  Neurological:     Mental Status: He is alert.  Psychiatric:        Mood and Affect: Mood is not anxious.    ED Results / Procedures / Treatments   Labs (all labs ordered are listed, but only abnormal results are displayed) Labs Reviewed  RESP PANEL BY RT-PCR (FLU A&B, COVID) [id number]    EKG None  Radiology DG Chest 2 View  Result Date: 12/18/2020 CLINICAL DATA:  Cough, shortness of breath EXAM: CHEST - 2 VIEW COMPARISON:  12/13/2019 FINDINGS: The heart size and mediastinal contours are within normal limits. Both lungs are clear. The visualized skeletal structures are unremarkable. IMPRESSION: No active cardiopulmonary disease. Electronically Signed   By: Judie Petit.  Shick M.D.   On: 12/18/2020 18:56    Procedures Procedures   Medications Ordered in ED Medications  acetaminophen (TYLENOL) tablet 650 mg (650 mg Oral Given 12/18/20 1615)  ibuprofen (ADVIL) tablet 600 mg (600 mg Oral Given 12/18/20 1822)    ED Course  I have reviewed the triage vital signs and the nursing notes.  Pertinent labs & imaging results that were available during my care of the patient were reviewed by me and considered in my medical decision making (see chart for details).    MDM Rules/Calculators/A&P                           Patient appears to have a mild viral URI.  Has benign vital signs.  Chest x-ray has been reviewed and is negative.  COVID/flu testing from triage is negative.  No indication for antibiotics at this time.  Discharged home with return precautions. Final Clinical Impression(s) / ED Diagnoses Final diagnoses:  Viral upper respiratory illness    Rx / DC Orders ED Discharge Orders     None        Pricilla Loveless, MD 12/18/20 2230

## 2021-01-25 ENCOUNTER — Encounter: Payer: Self-pay | Admitting: *Deleted

## 2021-01-25 NOTE — Congregational Nurse Program (Signed)
Dept: [phone]   Congregational Nurse Program Note  Date of Encounter: 01/25/2021  Past Medical History: Past Medical History:  Diagnosis Date   ADHD    Autism    Bipolar 1 disorder (HCC)    Oppositional defiant disorder    Schizophrenic disorder (HCC)    Seizures (HCC)    none since 25 years old    Encounter Details:  CNP Questionnaire - 01/25/21 1302       Questionnaire   Do you give verbal consent to treat you today? Yes    Location Patient Served  [facility]    Visit Setting Church or Organization    Patient Status Homeless    Insurance [facility]    Insurance Referral N/A    Medication N/A    Medical Provider Yes    Screening Referrals N/A    Medical Referral Non-[facility]    Medical Appointment Made Other    ED Visit Averted N/A    Life-Saving Intervention Made N/A               Dept: [phone]   Congregational Nurse Program Note  Date of Encounter: 01/25/2021  Past Medical History: Past Medical History:  Diagnosis Date   ADHD    Autism    Bipolar 1 disorder (HCC)    Oppositional defiant disorder    Schizophrenic disorder (HCC)    Seizures (HCC)    none since 25 years old    Encounter Details:  CNP Questionnaire - 01/25/21 1302       Questionnaire   Do you give verbal consent to treat you today? Yes    Location Patient Served  [facility]    Visit Setting Church or Organization    Patient Status Homeless    Insurance [facility]    Insurance Referral N/A    Medication N/A    Medical Provider Yes    Screening Referrals N/A    Medical Referral Non-[facility]    Medical Appointment Made Other    ED Visit Averted N/A    Life-Saving Intervention Made N/A               Dept: [phone]   Congregational Nurse Program Note  Date of Encounter: 01/25/2021  Past Medical History: Past Medical History:  Diagnosis Date   ADHD    Autism    Bipolar 1 disorder (HCC)    Oppositional defiant disorder     Schizophrenic disorder (HCC)    Seizures (HCC)    none since 25 years old    Encounter Details:  CNP Questionnaire - 01/25/21 1258       Questionnaire   Do you give verbal consent to treat you today? Yes    Location Patient Served  [facility]    Visit Setting Church or Organization    Patient Status Homeless    Insurance [facility]    Insurance Referral N/A    Medication N/A    Medical Provider No    Medical Referral Non- Health   [place]   Medical Appointment Made Other   walk-in [place]   Transportation Provided transportation assistance    Housing/Utilities No permanent housing    ED Visit Averted N/A    Life-Saving Intervention Made N/A            Client came into nurse's office for a blood pressure check. Vitals WNL. While in office, client reported he planned to go back to [facility] for medications and could go as a walk in client.  He requested help with transportation but he was unable to go today. Gave client two bus passes for transportation. He denies si and hi. Client saw Clinical research associate again later in the day and asked Clinical research associate to [employer] for a scheduled appt. Per call to [facility], they only provide therapy and no medication. Called [facility] and made an appt for Dec 6th at 9:00. Kashten Gowin W RN CN  [phone]

## 2021-01-27 ENCOUNTER — Emergency Department (HOSPITAL_COMMUNITY)
Admission: EM | Admit: 2021-01-27 | Discharge: 2021-01-27 | Disposition: A | Discharge status: Home / Self Care | Payer: Medicaid Other | Attending: Emergency Medicine | Admitting: Emergency Medicine

## 2021-01-27 ENCOUNTER — Other Ambulatory Visit: Payer: Self-pay

## 2021-01-27 ENCOUNTER — Encounter (HOSPITAL_COMMUNITY): Payer: Self-pay | Admitting: *Deleted

## 2021-01-27 DIAGNOSIS — M549 Dorsalgia, unspecified: Secondary | ICD-10-CM | POA: Diagnosis not present

## 2021-01-27 DIAGNOSIS — M79632 Pain in left forearm: Secondary | ICD-10-CM | POA: Diagnosis not present

## 2021-01-27 DIAGNOSIS — M25562 Pain in left knee: Secondary | ICD-10-CM | POA: Diagnosis not present

## 2021-01-27 DIAGNOSIS — F840 Autistic disorder: Secondary | ICD-10-CM | POA: Diagnosis not present

## 2021-01-27 DIAGNOSIS — M791 Myalgia, unspecified site: Secondary | ICD-10-CM | POA: Diagnosis not present

## 2021-01-27 DIAGNOSIS — F17210 Nicotine dependence, cigarettes, uncomplicated: Secondary | ICD-10-CM | POA: Diagnosis not present

## 2021-01-27 DIAGNOSIS — R52 Pain, unspecified: Secondary | ICD-10-CM

## 2021-01-27 MED ORDER — METHOCARBAMOL 500 MG PO TABS: 500.0000 mg | ORAL_TABLET | Freq: Two times a day (BID) | ORAL | 0 refills | Status: DC

## 2021-01-27 MED ORDER — ACETAMINOPHEN 325 MG PO TABS
650.0000 mg | ORAL_TABLET | Freq: Once | ORAL | Status: AC
  Administered 2021-01-27: 650 mg via ORAL
  Filled 2021-01-27: qty 2

## 2021-01-27 NOTE — ED Provider Notes (Signed)
[name] EMERGENCY DEPARTMENT Provider Note   CSN: [id number] Arrival date & time: 01/27/21  0349     History Chief Complaint  Patient presents with   Knee Pain   Back Pain    [name] is a 25 y.o. male patient with a history of bipolar 1 disorder, oppositional defiant disorder, ADHD, and autism who presents the emergency department [place] [facility] from [place] library with myalgias.  The patient reports that he has been having aching in his left knee, left forearm, and back since earlier today.  He characterizes the pain as aching.  He has been able to walk.  No known aggravating relieving factors.  He reports a remote history of a left knee injury several years ago.  He has had no recent falls or injuries.  No fever, chills, redness, swelling, wounds, numbness, weakness, visual changes, joint swelling, chest pain, shortness of breath.  Reports that he has previously taken Motrin and Tylenol with relief for his symptoms, but has not had a dose in 2 weeks.  He has no complaints at this time.  He has a history of frequent ER visits.  The history is provided by the patient. No language interpreter was used.      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  Social History   Tobacco Use   Smoking status: Some Days    Types: Cigarettes   Smokeless tobacco: Never  Vaping Use   Vaping Use: Former  Substance Use Topics   Alcohol use: No   Drug use: No    Comment: "BD"- stated he took one time     Home Medications Prior to Admission medications   Medication  Sig Start Date End Date Taking? Authorizing Provider  methocarbamol (ROBAXIN) 500 MG tablet Take 1 tablet (500 mg total) by mouth 2 (two) times daily. 01/27/21  Yes Amadu Schlageter A, PA-C  acetaminophen (TYLENOL 8 HOUR) 650 MG CR tablet Take 1 tablet (650 mg total) by mouth every 8 (eight) hours as needed for pain. 05/31/20   Petrucelli, Samantha R, PA-C  albuterol (VENTOLIN HFA) 108 (90 Base) MCG/ACT inhaler Inhale 1-2 puffs into the lungs every 6 (six) hours as needed for wheezing or shortness of breath. 12/14/19   Placido Sou, PA-C  lidocaine (LIDODERM) 5 % Place 1 patch onto the skin daily as needed. Apply patch to area most significant pain once per day.  Remove and discard patch within 12 hours of application. 05/31/20   Petrucelli, Samantha R, PA-C  triamcinolone cream (KENALOG) 0.1 % Apply 1 application topically 2 (two) times daily. 12/16/20   Muthersbaugh, Dahlia Client, PA-C  diphenhydrAMINE (BENADRYL) 25 MG tablet Take 1 tablet (25 mg total) by mouth every 6 (six) hours as needed. Patient not taking: Reported on 02/12/2020 07/31/19 05/27/20  Margarita Grizzle, MD  famotidine (PEPCID) 20 MG tablet Take 1 tablet (20 mg total) by mouth 2 (two) times daily. Patient not taking: Reported on 02/12/2020 10/02/19 05/27/20  Deretha Emory,  Lorin Picket, MD    Allergies    Daytrana [methylphenidate]  Review of Systems   Review of Systems  Constitutional:  Negative for activity change, chills and fever.  HENT:  Negative for congestion, sore throat and voice change.   Respiratory:  Negative for shortness of breath and wheezing.   Cardiovascular:  Negative for chest pain and palpitations.  Gastrointestinal:  Negative for abdominal pain, constipation, diarrhea, nausea and vomiting.  Genitourinary:  Negative for urgency.  Musculoskeletal:  Positive for arthralgias, back pain and myalgias. Negative for neck pain.  Skin:  Negative for rash and wound.  Neurological:  Negative for dizziness, seizures, syncope, weakness,  light-headedness and numbness.   Physical Exam Updated Vital Signs BP 119/78   Pulse 99   Temp 97.9 F (36.6 C)   Resp 16   SpO2 100%   Physical Exam Vitals and nursing note reviewed.  Constitutional:      Appearance: He is well-developed. He is not ill-appearing, toxic-appearing or diaphoretic.  HENT:     Head: Normocephalic.  Eyes:     Conjunctiva/sclera: Conjunctivae normal.  Cardiovascular:     Rate and Rhythm: Normal rate and regular rhythm.     Heart sounds: No murmur heard. Pulmonary:     Effort: Pulmonary effort is normal. No respiratory distress.     Breath sounds: No stridor. No wheezing, rhonchi or rales.  Chest:     Chest wall: No tenderness.  Abdominal:     General: There is no distension.     Palpations: Abdomen is soft. There is no mass.     Tenderness: There is no abdominal tenderness. There is no right CVA tenderness, left CVA tenderness, guarding or rebound.     Hernia: No hernia is present.     Comments: Abdomen is soft, nontender, nondistended.  Musculoskeletal:     Cervical back: Neck supple.     Comments: Spine is nontender.  No crepitus or step-offs.  No paraspinal muscular tenderness.  No rashes noted to the back.  Full active and passive range of motion of all joints of the upper and lower extremities.  No redness, warmth, swelling.  He is neurovascularly intact to the upper and lower extremities.  Peripheral pulses are 2+ and symmetric.  Skin:    General: Skin is warm and dry.     Comments: There is a 1 cm well-healed wound noted to the superolateral aspect of the left knee.  No wound dehiscence.  Neurological:     Mental Status: He is alert.     Comments: Ambulating without difficulty.  Psychiatric:        Behavior: Behavior normal.    ED Results / Procedures / Treatments   Labs (all labs ordered are listed, but only abnormal results are displayed) Labs Reviewed - No data to display  EKG None  Radiology No results  found.  Procedures Procedures   Medications Ordered in ED Medications  acetaminophen (TYLENOL) tablet 650 mg (has no administration in time range)    ED Course  I have reviewed the triage vital signs and the nursing notes.  Pertinent labs & imaging results that were available during my care of the patient were reviewed by me and considered in my medical decision making (see chart for details).    MDM Rules/Calculators/A&P                           25 year old male who was brought to the emergency department  from outside of [employer] with multiple complaints of arthralgias and myalgias.  History is a little unclear as he initially tells me that symptoms have been ongoing since earlier today, but have been off and on chronically.  No recent trauma or injuries.  Vital signs are stable.  His physical exam is very reassuring.  No concern for gout, septic joint, fracture, cellulitis, compartment syndrome, transverse myelitis, epidural abscess.  At this time, I feel that no further urgent or emergent work-up is indicated.  We will give Tylenol in the ER.  Will discharge with a short course of muscle relaxers.  He can follow-up with primary care.  ER return precautions given.  Final Clinical Impression(s) / ED Diagnoses Final diagnoses:  Body aches    Rx / DC Orders ED Discharge Orders          Ordered    methocarbamol (ROBAXIN) 500 MG tablet  2 times daily        01/27/21 0400             Shannah Conteh, Coral Else, PA-C 01/27/21 0422    Gilda Crease, MD 01/27/21 [phone]

## 2021-01-27 NOTE — ED Triage Notes (Signed)
Pt arrived by EMS from outside [employer] c/o knee and back pain  EMS VS 138/76, pulse 80, resp 16, 99% RA

## 2021-01-27 NOTE — Discharge Instructions (Addendum)
Thank you for allowing me to care for you today in the Emergency Department.   Take 650 mg of Tylenol or 600 mg of ibuprofen with food every 6 hours for pain.  You can alternate between these 2 medications every 3 hours if your pain returns.  For instance, you can take Tylenol at noon, followed by a dose of ibuprofen at 3, followed by second dose of Tylenol and 6.  He can take 1 dose of Robaxin as needed for muscle pain or spasms.  Follow-up with primary care.  Use the number attached to your discharge paperwork to get established with primary care if you do not currently have someone.  Return for worsening symptoms.

## 2021-02-06 ENCOUNTER — Encounter (HOSPITAL_COMMUNITY): Payer: Self-pay | Admitting: Emergency Medicine

## 2021-02-06 ENCOUNTER — Emergency Department (HOSPITAL_COMMUNITY): Payer: Medicaid Other

## 2021-02-06 ENCOUNTER — Emergency Department (HOSPITAL_COMMUNITY)
Admission: EM | Admit: 2021-02-06 | Discharge: 2021-02-06 | Disposition: A | Discharge status: Home / Self Care | Payer: Medicaid Other | Attending: Emergency Medicine | Admitting: Emergency Medicine

## 2021-02-06 ENCOUNTER — Other Ambulatory Visit: Payer: Self-pay

## 2021-02-06 DIAGNOSIS — F17210 Nicotine dependence, cigarettes, uncomplicated: Secondary | ICD-10-CM | POA: Diagnosis not present

## 2021-02-06 DIAGNOSIS — S0990XA Unspecified injury of head, initial encounter: Secondary | ICD-10-CM | POA: Diagnosis present

## 2021-02-06 DIAGNOSIS — F840 Autistic disorder: Secondary | ICD-10-CM | POA: Diagnosis not present

## 2021-02-06 DIAGNOSIS — S0083XA Contusion of other part of head, initial encounter: Secondary | ICD-10-CM | POA: Diagnosis not present

## 2021-02-06 DIAGNOSIS — M545 Low back pain, unspecified: Secondary | ICD-10-CM | POA: Diagnosis not present

## 2021-02-06 DIAGNOSIS — S60221A Contusion of right hand, initial encounter: Secondary | ICD-10-CM

## 2021-02-06 MED ORDER — ACETAMINOPHEN 325 MG PO TABS
650.0000 mg | ORAL_TABLET | Freq: Once | ORAL | Status: AC
  Administered 2021-02-06: 650 mg via ORAL
  Filled 2021-02-06: qty 2

## 2021-02-06 MED ORDER — ACETAMINOPHEN ER 650 MG PO TBCR: 650.0000 mg | EXTENDED_RELEASE_TABLET | Freq: Three times a day (TID) | ORAL | 0 refills | Status: DC | PRN

## 2021-02-06 MED ORDER — IBUPROFEN 800 MG PO TABS: 800.0000 mg | ORAL_TABLET | Freq: Three times a day (TID) | ORAL | 0 refills | Status: DC | PRN
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
## 2021-03-08 ENCOUNTER — Encounter: Payer: Self-pay | Admitting: *Deleted

## 2021-03-08 NOTE — Congregational Nurse Program (Signed)
  Dept: [phone]   Congregational Nurse Program Note  Date of Encounter: 03/08/2021  Past Medical History: Past Medical History:  Diagnosis Date   ADHD    Autism    Bipolar 1 disorder (HCC)    Oppositional defiant disorder    Schizophrenic disorder (HCC)    Seizures (HCC)    none since 25 years old    Encounter Details:  CNP Questionnaire - 03/08/21 1400       Questionnaire   Do you give verbal consent to treat you today? Yes    Location Patient Served  [facility]    Visit [place] [address]Phone/Text/Email    Patient Status Unknown   currently staying with a friend   Insurance [employer] Referral N/A    Medication N/A    Medical Provider Yes    Screening Referrals N/A    Medical Referral Non-[facility] PCP/Clinic;Dental    Medical Appointment Made Dental    Food N/A    Transportation Need transportation assistance    Housing/Utilities N/A    Interpersonal Safety N/A    Intervention Support    ED Visit Averted N/A    Life-Saving Intervention Made N/A            Client came into office seeking help with getting a dentist appt. Called multiple dentist and many would not except medicaid or only for certain services. Called dr Gerlene Burdock [facility] in [place] as client had been seen there when he was younger. Made an appt for Thursday Dec 1 at 3:00. Dentist will need medicaid card and ID card. Client lost both. Called and he is going to [facility] this afternoon for ID. Called SS office x 2 and was given different numbers to call. Ran out of time for afternoon. Client is coming back to [facility] tomorrow and requesting a CSWEI for additional assistance. Client is asking friends for a ride to his dental appt. If he cannot get a ride, he will return to writer's office prior to appt to seek assistance with transportation. Kaysia Willard W RN CN [phone]

## 2021-03-23 ENCOUNTER — Ambulatory Visit (HOSPITAL_COMMUNITY): Payer: Medicaid Other | Admitting: Licensed Clinical Social Worker

## 2021-06-04 ENCOUNTER — Emergency Department (HOSPITAL_COMMUNITY): Payer: Medicaid Other

## 2021-06-04 ENCOUNTER — Emergency Department (HOSPITAL_COMMUNITY)
Admission: EM | Admit: 2021-06-04 | Discharge: 2021-06-04 | Disposition: A | Discharge status: Home / Self Care | Payer: Medicaid Other | Attending: Emergency Medicine | Admitting: Emergency Medicine

## 2021-06-04 ENCOUNTER — Encounter (HOSPITAL_COMMUNITY): Payer: Self-pay

## 2021-06-04 DIAGNOSIS — S0993XA Unspecified injury of face, initial encounter: Secondary | ICD-10-CM | POA: Diagnosis present

## 2021-06-04 DIAGNOSIS — R040 Epistaxis: Secondary | ICD-10-CM | POA: Diagnosis not present

## 2021-06-04 DIAGNOSIS — F840 Autistic disorder: Secondary | ICD-10-CM | POA: Insufficient documentation

## 2021-06-04 DIAGNOSIS — S0083XA Contusion of other part of head, initial encounter: Secondary | ICD-10-CM | POA: Diagnosis not present

## 2021-06-04 LAB — CBC WITH DIFFERENTIAL/PLATELET
Abs Immature Granulocytes: 0 10*3/uL (ref 0.00–0.07)
Basophils Absolute: 0 10*3/uL (ref 0.0–0.1)
Basophils Relative: 1 %
Eosinophils Absolute: 0.1 10*3/uL (ref 0.0–0.5)
Eosinophils Relative: 1 %
HCT: 44.6 % (ref 39.0–52.0)
Hemoglobin: 15.7 g/dL (ref 13.0–17.0)
Immature Granulocytes: 0 %
Lymphocytes Relative: 60 %
Lymphs Abs: 4.1 10*3/uL — ABNORMAL HIGH (ref 0.7–4.0)
MCH: 30.4 pg (ref 26.0–34.0)
MCHC: 35.2 g/dL (ref 30.0–36.0)
MCV: 86.3 fL (ref 80.0–100.0)
Monocytes Absolute: 0.7 10*3/uL (ref 0.1–1.0)
Monocytes Relative: 10 %
Neutro Abs: 1.9 10*3/uL (ref 1.7–7.7)
Neutrophils Relative %: 28 %
Platelets: 217 10*3/uL (ref 150–400)
RBC: 5.17 MIL/uL (ref 4.22–5.81)
RDW: 12 % (ref 11.5–15.5)
WBC: 6.8 10*3/uL (ref 4.0–10.5)
nRBC: 0 % (ref 0.0–0.2)

## 2021-06-04 LAB — BASIC METABOLIC PANEL
Anion gap: 11 (ref 5–15)
BUN: 6 mg/dL (ref 6–20)
CO2: 25 mmol/L (ref 22–32)
Calcium: 9.3 mg/dL (ref 8.9–10.3)
Chloride: 103 mmol/L (ref 98–111)
Creatinine, Ser: 0.9 mg/dL (ref 0.61–1.24)
GFR, Estimated: >60 mL/min (ref >60)
Glucose, Bld: 99 mg/dL (ref 70–99)
Potassium: 3.5 mmol/L (ref 3.5–5.1)
Sodium: 139 mmol/L (ref 135–145)

## 2021-06-04 LAB — SAMPLE TO BLOOD BANK

## 2021-06-04 MED ORDER — ACETAMINOPHEN 500 MG PO TABS
1000.0000 mg | ORAL_TABLET | Freq: Once | ORAL | Status: AC
  Administered 2021-06-04: 1000 mg via ORAL
  Filled 2021-06-04: qty 2

## 2021-06-04 MED ORDER — SODIUM CHLORIDE 0.9 % IV BOLUS: 1000.0000 mL | Freq: Once | INTRAVENOUS | Status: DC

## 2021-06-04 MED ORDER — FENTANYL CITRATE PF 50 MCG/ML IJ SOSY: 50.0000 ug | PREFILLED_SYRINGE | Freq: Once | INTRAMUSCULAR | Status: DC
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
## 2021-06-04 NOTE — Discharge Instructions (Signed)
For pain control you may take at 1000 mg of Tylenol every 8 hours as needed. 
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
## 2021-08-29 IMAGING — DX DG CHEST 2V
2 series · 2 of 2 positions shown · non-contrast
Comparison: 05/22/2019

CLINICAL DATA: Right-sided chest pain radiating to right clavicle,
short of breath, tobacco abuse

EXAM:
CHEST - 2 VIEW

[chest lat]
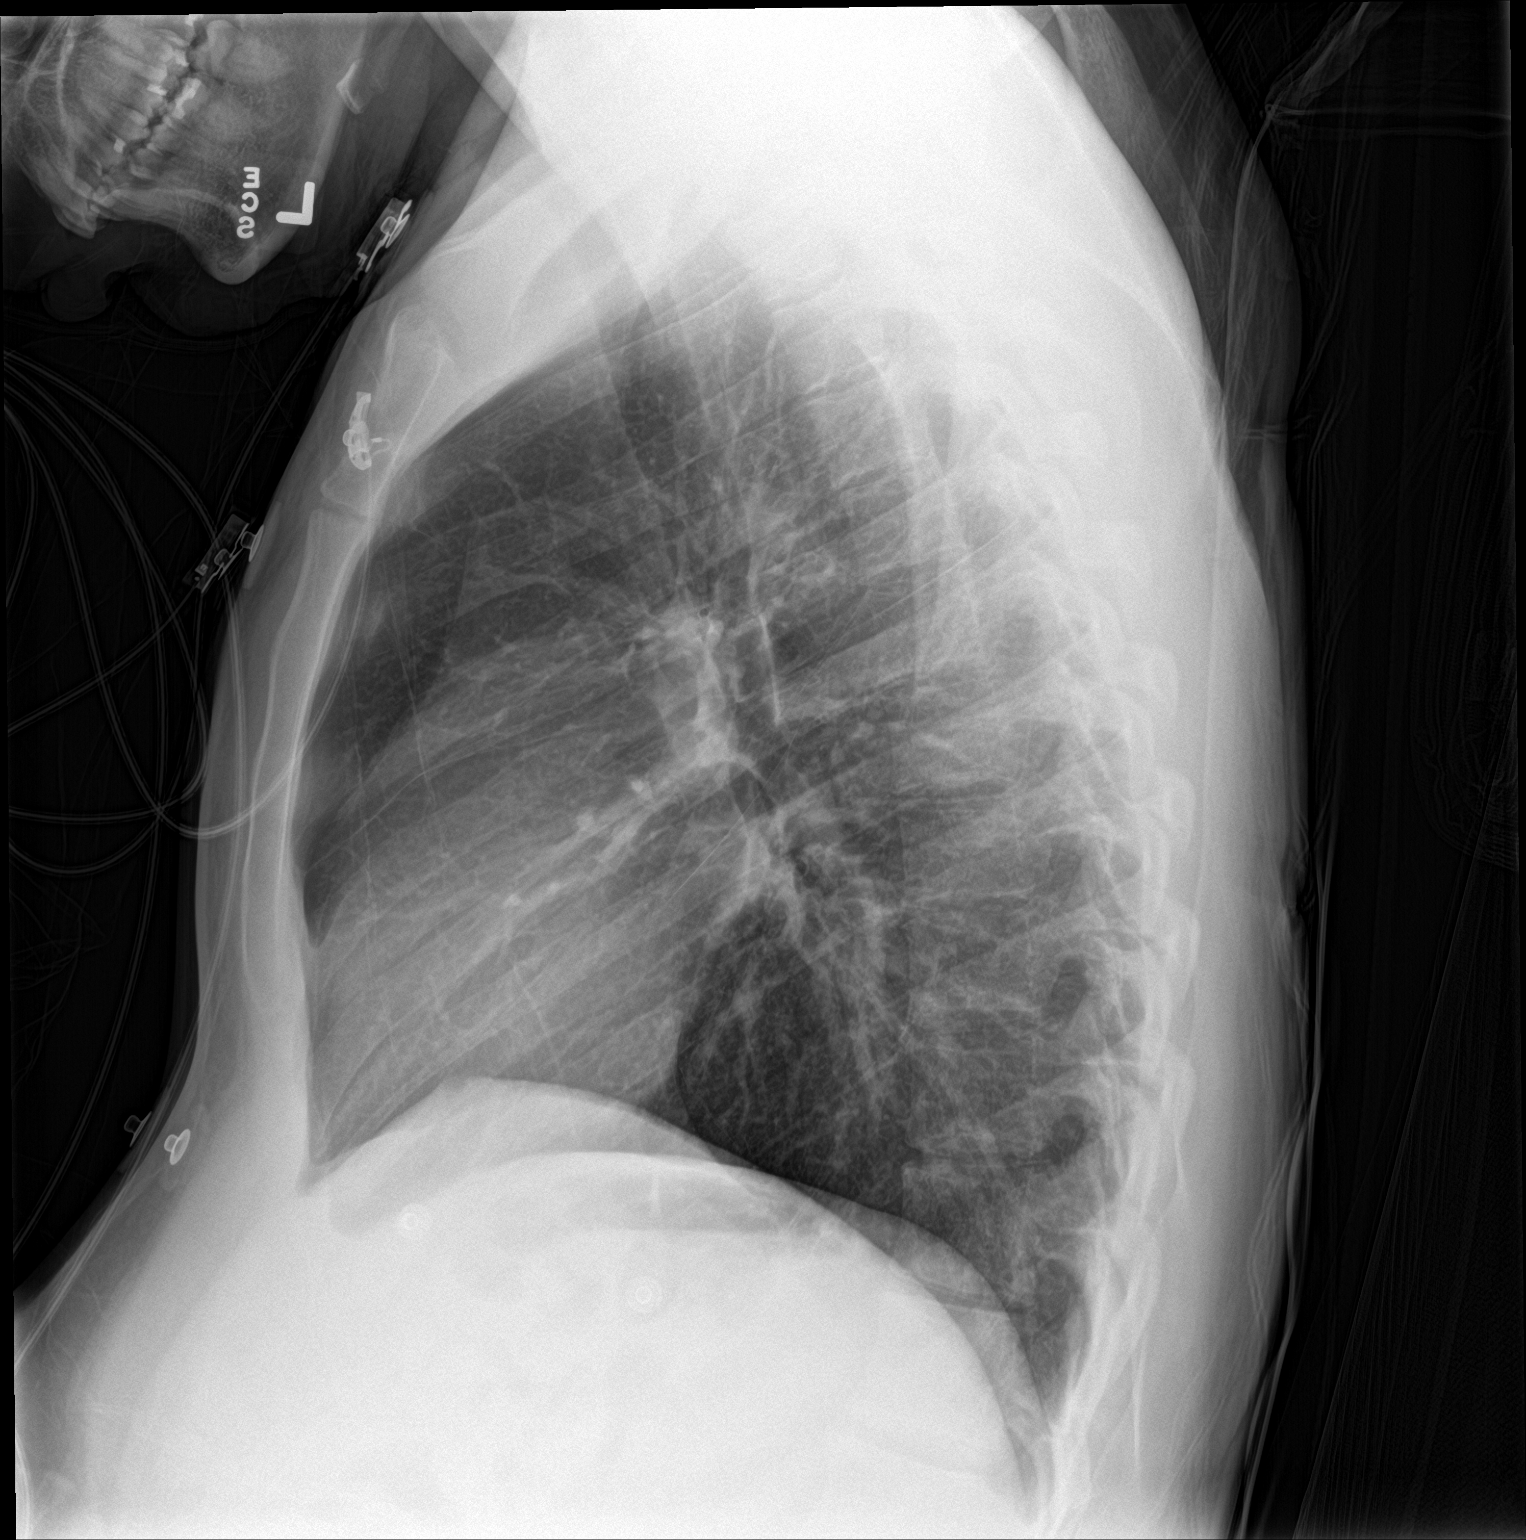

[chest ap]
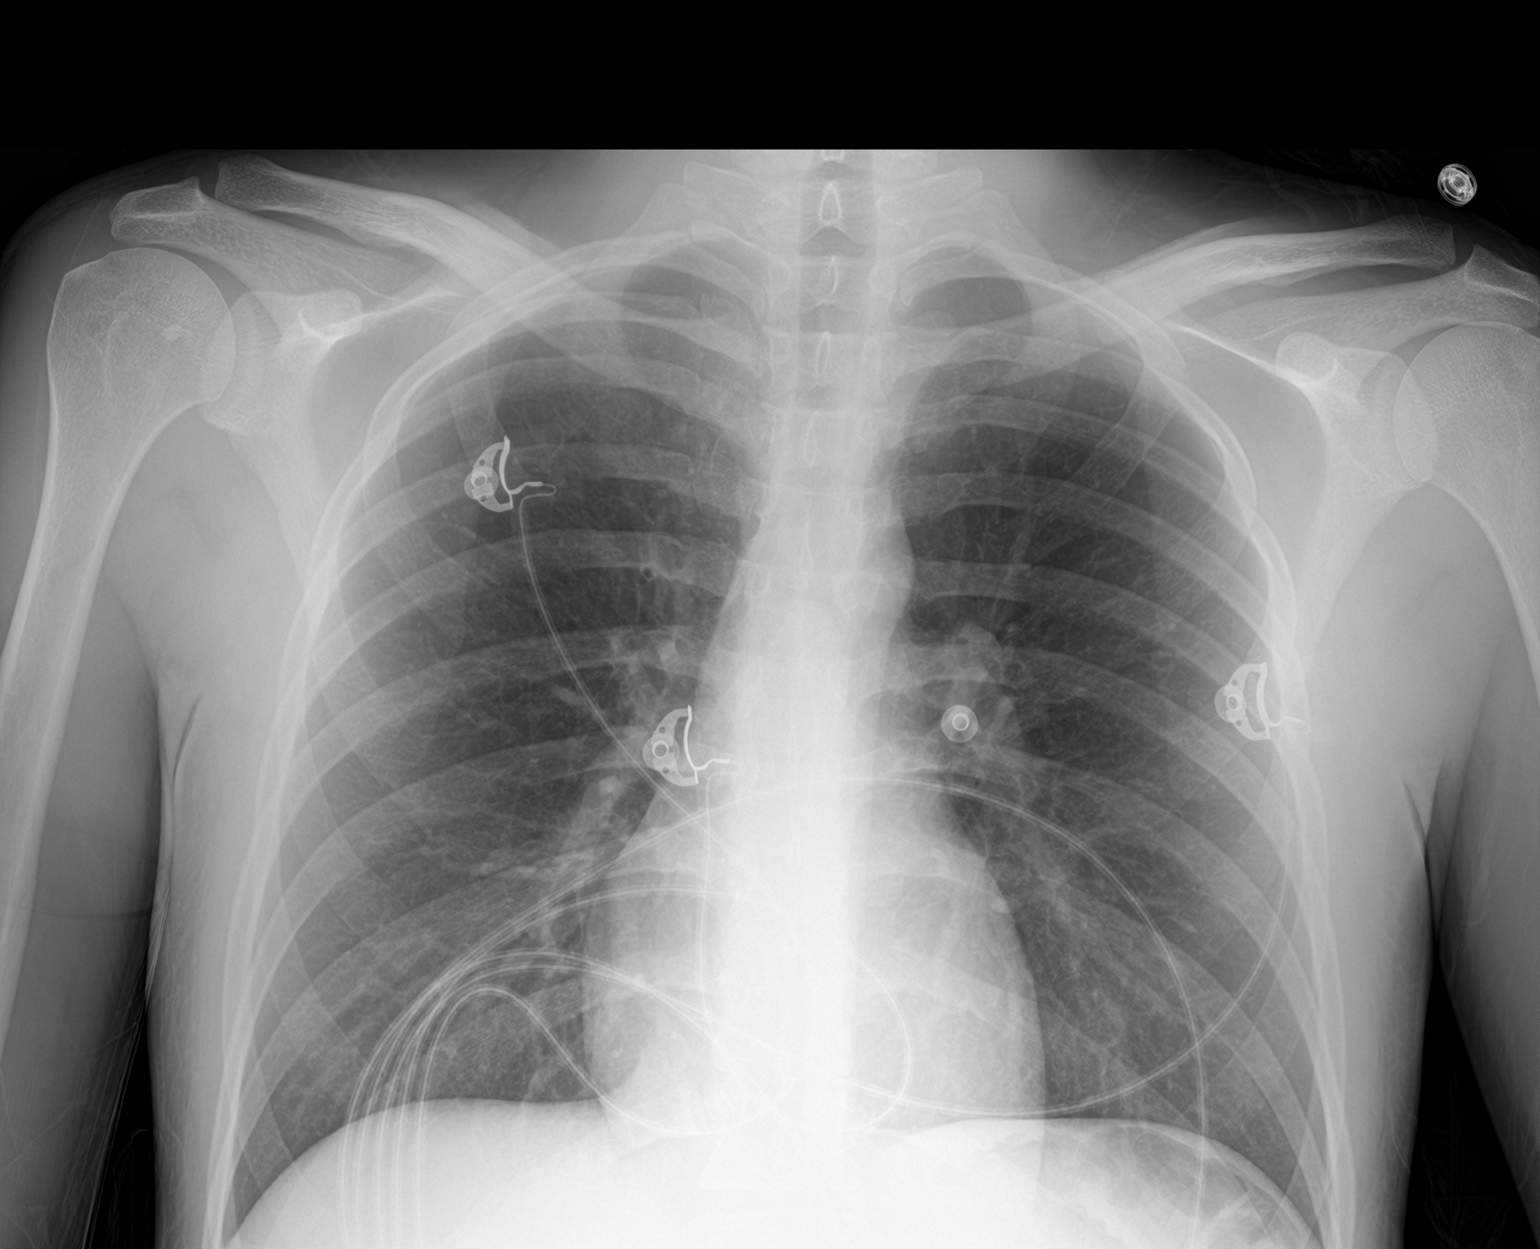

[2 of 2 positions shown; findings below may reference images not displayed]

FINDINGS: The heart size and mediastinal contours are within normal limits.
Both lungs are clear. The visualized skeletal structures are
unremarkable.
IMPRESSION: No active cardiopulmonary disease.

## 2022-01-10 ENCOUNTER — Other Ambulatory Visit: Payer: Self-pay

## 2022-01-10 ENCOUNTER — Emergency Department (HOSPITAL_COMMUNITY)
Admission: EM | Admit: 2022-01-10 | Discharge: 2022-01-10 | Disposition: A | Discharge status: Home / Self Care | Payer: Commercial Managed Care - HMO | Attending: Emergency Medicine | Admitting: Emergency Medicine

## 2022-01-10 ENCOUNTER — Encounter (HOSPITAL_COMMUNITY): Payer: Self-pay | Admitting: *Deleted

## 2022-01-10 DIAGNOSIS — F17210 Nicotine dependence, cigarettes, uncomplicated: Secondary | ICD-10-CM | POA: Insufficient documentation

## 2022-01-10 DIAGNOSIS — M5442 Lumbago with sciatica, left side: Secondary | ICD-10-CM | POA: Diagnosis not present

## 2022-01-10 DIAGNOSIS — M5432 Sciatica, left side: Secondary | ICD-10-CM

## 2022-01-10 DIAGNOSIS — M25562 Pain in left knee: Secondary | ICD-10-CM | POA: Diagnosis not present

## 2022-01-10 DIAGNOSIS — G8929 Other chronic pain: Secondary | ICD-10-CM | POA: Insufficient documentation

## 2022-01-10 DIAGNOSIS — M545 Low back pain, unspecified: Secondary | ICD-10-CM | POA: Diagnosis present

## 2022-01-10 MED ORDER — IBUPROFEN 400 MG PO TABS
600.0000 mg | ORAL_TABLET | Freq: Once | ORAL | Status: AC
  Administered 2022-01-10: 600 mg via ORAL
  Filled 2022-01-10: qty 1

## 2022-01-10 MED ORDER — IBUPROFEN 600 MG PO TABS: 600.0000 mg | ORAL_TABLET | Freq: Four times a day (QID) | ORAL | 0 refills | Status: DC | PRN

## 2022-01-10 MED ORDER — PREDNISONE 10 MG PO TABS: 40.0000 mg | ORAL_TABLET | Freq: Every day | ORAL | 0 refills | Status: DC

## 2022-01-10 NOTE — ED Provider Notes (Signed)
[name] EMERGENCY DEPARTMENT Provider Note   CSN: [id number] Arrival date & time: 01/10/22  0247     History  Chief Complaint  Patient presents with   Back Pain    [name] is a 26 y.o. male.   Back Pain   26 year old male presents emergency department with complaints of back pain.  Patient states he has a history of sciatica and this feels the exact same.  He states that symptoms began yesterday after waking from a nap when he slipped onto a hard floor.  Notes radiation down his left leg.  He is also complaining of left knee pain that is chronic in nature.  Denies any recent trauma but states its been bothering him for "months and months."  He states he was prescribed a knee brace in the past but has since lost it and is requesting another.  Denies feelings of instability in affected knee.  States he has been able to ambulate but the knee mainly affects him when he is playing basketball.  Denies weakness/sensory deficit in lower extremities, fever, history of IV drug use, saddle anesthesia, bowel/bladder dysfunction, no malignancy, prolonged steroid use.  Past medical history significant for ADHD, bipolar 1, ODD, schizophrenia disorder, seizures  Home Medications Prior to Admission medications   Medication Sig Start Date End Date Taking? Authorizing Provider  ibuprofen (ADVIL) 600 MG tablet Take 1 tablet (600 mg total) by mouth every 6 (six) hours as needed. 01/10/22  Yes Sherian Maroon A, PA  predniSONE (DELTASONE) 10 MG tablet Take 4 tablets (40 mg total) by mouth daily. 01/10/22  Yes Sherian Maroon A, PA  acetaminophen (TYLENOL 8 HOUR) 650 MG CR tablet Take 1 tablet (650 mg total) by mouth every 8 (eight) hours as needed for pain. 02/06/21   Terrilee Files, MD  albuterol (VENTOLIN HFA) 108 (90 Base) MCG/ACT inhaler Inhale 1-2 puffs into the lungs every 6 (six) hours as needed for wheezing or shortness of breath. 12/14/19   Placido Sou, PA-C   lidocaine (LIDODERM) 5 % Place 1 patch onto the skin daily as needed. Apply patch to area most significant pain once per day.  Remove and discard patch within 12 hours of application. 05/31/20   Petrucelli, Samantha R, PA-C  methocarbamol (ROBAXIN) 500 MG tablet Take 1 tablet (500 mg total) by mouth 2 (two) times daily. 01/27/21   McDonald, Mia A, PA-C  triamcinolone cream (KENALOG) 0.1 % Apply 1 application topically 2 (two) times daily. 12/16/20   Muthersbaugh, Dahlia Client, PA-C  diphenhydrAMINE (BENADRYL) 25 MG tablet Take 1 tablet (25 mg total) by mouth every 6 (six) hours as needed. Patient not taking: Reported on 02/12/2020 07/31/19 05/27/20  Margarita Grizzle, MD  famotidine (PEPCID) 20 MG tablet Take 1 tablet (20 mg total) by mouth 2 (two) times daily. Patient not taking: Reported on 02/12/2020 10/02/19 05/27/20  Vanetta Mulders, MD      Allergies    Daytrana [methylphenidate]    Review of Systems   Review of Systems  Musculoskeletal:  Positive for back pain.  All other systems reviewed and are negative.   Physical Exam Updated Vital Signs BP 101/76 (BP Location: Left Arm)   Pulse 73   Temp 97.8 F (36.6 C) (Oral)   Resp 18   SpO2 99%  Physical Exam Vitals and nursing note reviewed.  Constitutional:      General: He is not in acute distress.    Appearance: He is well-developed.  HENT:  Head: Normocephalic and atraumatic.  Eyes:     Conjunctiva/sclera: Conjunctivae normal.  Cardiovascular:     Rate and Rhythm: Normal rate and regular rhythm.     Heart sounds: No murmur heard. Pulmonary:     Effort: Pulmonary effort is normal. No respiratory distress.     Breath sounds: Normal breath sounds.  Abdominal:     Palpations: Abdomen is soft.     Tenderness: There is no abdominal tenderness.  Musculoskeletal:        General: No swelling.     Cervical back: Neck supple.     Right lower leg: No edema.     Left lower leg: No edema.     Comments: No midline tenderness of cervical,  thoracic, lumbar spine with no obvious step-off or deformity.  Patient has left paraspinal tenderness on exam of the lumbar area.  Straight leg raise positive on the left.  Muscle strength 5 out of 5 lower extremities.  Patient complaining of no sensory deficits along major nerve distribution of the lower extremities.  Dorsalis pedis pulses full and intact bilaterally.  DTR symmetric and equal bilaterally.  Patient has full active range of motion of left knee.  No feelings of joint stability with anterior, posterior drawer, McMurray, Lachman's.  No obvious swelling or overlying skin abnormalities.  No bony tenderness to palpation of patella, distal femur, proximal tibia/fibula.  Skin:    General: Skin is warm and dry.     Capillary Refill: Capillary refill takes less than 2 seconds.  Neurological:     Mental Status: He is alert.  Psychiatric:        Mood and Affect: Mood normal.     ED Results / Procedures / Treatments   Labs (all labs ordered are listed, but only abnormal results are displayed) Labs Reviewed - No data to display  EKG None  Radiology No results found.  Procedures Procedures    Medications Ordered in ED Medications  ibuprofen (ADVIL) tablet 600 mg (600 mg Oral Given 01/10/22 0930)    ED Course/ Medical Decision Making/ A&P                           Medical Decision Making  This patient presents to the ED for concern of back/knee pain, this involves an extensive number of treatment options, and is a complaint that carries with it a high risk of complications and morbidity.  The differential diagnosis includes The emergent differential diagnosis for back pain includes but is not limited to fracture, muscle strain, cauda equina, spinal stenosis. DDD, ankylosing spondylitis, acute ligamentous injury, disk herniation, spondylolisthesis, Epidural compression syndrome, metastatic cancer, transverse myelitis, vertebral osteomyelitis, diskitis, kidney stone,  pyelonephritis, AAA, Perforated ulcer, Retrocecal appendicitis, pancreatitis, bowel obstruction, retroperitoneal hemorrhage or mass, meningitis.    Co morbidities that complicate the patient evaluation  See HPI   Additional history obtained:  Additional history obtained from EMR External records from outside source obtained and reviewed including lumbar spine x-ray from 02/06/2021 indicating no acute fracture   Lab Tests:  N/a   Imaging Studies ordered:  N/a   Cardiac Monitoring: / EKG:  The patient was maintained on a cardiac monitor.  I personally viewed and interpreted the cardiac monitored which showed an underlying rhythm of: Sinus rhythm   Consultations Obtained:  N/a   Problem List / ED Course / Critical interventions / Medication management  Back/knee pain I ordered medication including ibuprofen for pain  Reevaluation of the patient after these medicines showed that the patient improved I have reviewed the patients home medicines and have made adjustments as needed   Social Determinants of Health:  Patient smokes cigarettes.  Denies illicit drug use.   Test / Admission - Considered:  Left-sided sciatica/left knee pain Vitals signs within normal range and stable throughout visit. Patient's back pains likely secondary to sciatica type symptoms.  Patient's left knee pain chronic in nature.  No obvious fracture/dislocation or feelings of joint instability.  Doubt cauda equina.  Doubt spinal epidural abscess.  Doubt transverse myelitis.  Doubt abdominal pathology given lack of abdominal tenderness.  Patient reports similar symptoms as experienced in the past.  To be treated outpatient with oral ibuprofen as well as prednisone pack.  Treatment plan discussed length with patient he knowledge understanding was agreeable to said plan.  Close follow-up with orthopedics recommended outpatient.  Knee brace was applied while in emergency department. Worrisome signs  and symptoms were discussed with the patient, and the patient acknowledged understanding to return to the ED if noticed. Patient was stable upon discharge.          Final Clinical Impression(s) / ED Diagnoses Final diagnoses:  Sciatica of left side  Chronic pain of left knee    Rx / DC Orders ED Discharge Orders          Ordered    ibuprofen (ADVIL) 600 MG tablet  Every 6 hours PRN        01/10/22 0939    predniSONE (DELTASONE) 10 MG tablet  Daily        01/10/22 [place]ley, [place] A, PA 01/10/22 0940    Pattricia Boss, MD 01/11/22 [phone]

## 2022-01-10 NOTE — Discharge Instructions (Signed)
Note your work-up today was overall reassuring.  Your left-sided sciatica will be treated with oral prednisone as well as ibuprofen outpatient.  We provided a knee brace for her left knee while emergency department.  Please use as needed for additional support.  I recommend close follow-up with orthopedics regarding your left knee.  Please do not hesitate to return to the emergency department for worrisome signs and symptoms we discussed become apparent.

## 2022-01-10 NOTE — ED Triage Notes (Signed)
Left knee and left lower back pain. Hx of sciatica, feels like his back is popping more than normal. Quick, normal gait.

## 2022-03-29 ENCOUNTER — Emergency Department (HOSPITAL_COMMUNITY)
Admission: EM | Admit: 2022-03-29 | Discharge: 2022-03-30 | Disposition: A | Discharge status: Home / Self Care | Payer: Commercial Managed Care - HMO | Attending: Emergency Medicine | Admitting: Emergency Medicine

## 2022-03-29 ENCOUNTER — Encounter (HOSPITAL_COMMUNITY): Payer: Self-pay

## 2022-03-29 ENCOUNTER — Other Ambulatory Visit: Payer: Self-pay

## 2022-03-29 DIAGNOSIS — M79642 Pain in left hand: Secondary | ICD-10-CM

## 2022-03-29 DIAGNOSIS — W228XXA Striking against or struck by other objects, initial encounter: Secondary | ICD-10-CM | POA: Insufficient documentation

## 2022-03-29 DIAGNOSIS — S60511A Abrasion of right hand, initial encounter: Secondary | ICD-10-CM | POA: Insufficient documentation

## 2022-03-29 NOTE — ED Provider Triage Note (Signed)
Emergency Medicine Provider Triage Evaluation Note  [name] , a 26 y.o. male  was evaluated in triage.  Pt complains of left hand pain after punched a wall yesterday.  Pain along 5th metacarpal.  He is left hand dominant.  Review of Systems  Positive: Left hand pain Negative: numbness  Physical Exam  BP 117/75 (BP Location: Right Arm)   Pulse 77   Temp 98 F (36.7 C)   Resp 16   SpO2 99%  Gen:   Awake, no distress   Resp:  Normal effort  MSK:   Moves extremities without difficulty  Other:  Abrasions noted to knuckles of left hand, minimal sweling along 5th metacarpal, radial pulse intact  Medical Decision Making  Medically screening exam initiated at 11:56 PM.  Appropriate orders placed.  Cleo Villamizar was informed that the remainder of the evaluation will be completed by another provider, this initial triage assessment does not replace that evaluation, and the importance of remaining in the ED until their evaluation is complete.  Hand pain after punching wall.  X-ray ordered to assess for boxer's fracture.   Garlon Hatchet, PA-C 03/29/22 2356

## 2022-03-29 NOTE — ED Triage Notes (Signed)
Pt states that he punched a wall yesterday and hurt his L wrist

## 2022-03-30 ENCOUNTER — Emergency Department (HOSPITAL_COMMUNITY): Payer: Commercial Managed Care - HMO

## 2022-03-30 DIAGNOSIS — S60511A Abrasion of right hand, initial encounter: Secondary | ICD-10-CM | POA: Diagnosis not present

## 2022-03-30 MED ORDER — IBUPROFEN 800 MG PO TABS
800.0000 mg | ORAL_TABLET | Freq: Once | ORAL | Status: AC
  Administered 2022-03-30: 800 mg via ORAL
  Filled 2022-03-30: qty 1

## 2022-03-30 NOTE — ED Provider Notes (Signed)
[facility] EMERGENCY DEPARTMENT Provider Note   CSN: [id number] Arrival date & time: 03/29/22  2339     History  Chief Complaint  Patient presents with   Hand Pain    Anthonyjames Broom is a 26 y.o. male.  The history is provided by the patient and medical records.  Hand Pain   26 y.o. M here with left hand pain after punching a wall yesterday.  Endorses pain along 5th metacarpal.  Left hand dominant.  No intervention tried PTA.  Home Medications Prior to Admission medications   Medication Sig Start Date End Date Taking? Authorizing Provider  acetaminophen (TYLENOL 8 HOUR) 650 MG CR tablet Take 1 tablet (650 mg total) by mouth every 8 (eight) hours as needed for pain. 02/06/21   Terrilee Files, MD  albuterol (VENTOLIN HFA) 108 (90 Base) MCG/ACT inhaler Inhale 1-2 puffs into the lungs every 6 (six) hours as needed for wheezing or shortness of breath. 12/14/19   Placido Sou, PA-C  ibuprofen (ADVIL) 600 MG tablet Take 1 tablet (600 mg total) by mouth every 6 (six) hours as needed. 01/10/22   Sherian Maroon A, PA  lidocaine (LIDODERM) 5 % Place 1 patch onto the skin daily as needed. Apply patch to area most significant pain once per day.  Remove and discard patch within 12 hours of application. 05/31/20   Petrucelli, Samantha R, PA-C  methocarbamol (ROBAXIN) 500 MG tablet Take 1 tablet (500 mg total) by mouth 2 (two) times daily. 01/27/21   McDonald, Mia A, PA-C  predniSONE (DELTASONE) 10 MG tablet Take 4 tablets (40 mg total) by mouth daily. 01/10/22   Peter Garter, PA  triamcinolone cream (KENALOG) 0.1 % Apply 1 application topically 2 (two) times daily. 12/16/20   Muthersbaugh, Dahlia Client, PA-C  diphenhydrAMINE (BENADRYL) 25 MG tablet Take 1 tablet (25 mg total) by mouth every 6 (six) hours as needed. Patient not taking: Reported on 02/12/2020 07/31/19 05/27/20  Margarita Grizzle, MD  famotidine (PEPCID) 20 MG tablet Take 1 tablet (20 mg total) by mouth 2 (two) times  daily. Patient not taking: Reported on 02/12/2020 10/02/19 05/27/20  Vanetta Mulders, MD      Allergies    Daytrana [methylphenidate]    Review of Systems   Review of Systems  Musculoskeletal:  Positive for arthralgias.  All other systems reviewed and are negative.   Physical Exam Updated Vital Signs BP 117/75 (BP Location: Right Arm)   Pulse 77   Temp 98 F (36.7 C)   Resp 16   SpO2 99%  Physical Exam Vitals and nursing note reviewed.  Constitutional:      Appearance: He is well-developed.  HENT:     Head: Normocephalic and atraumatic.  Eyes:     Conjunctiva/sclera: Conjunctivae normal.     Pupils: Pupils are equal, round, and reactive to light.  Cardiovascular:     Rate and Rhythm: Normal rate and regular rhythm.     Heart sounds: Normal heart sounds.  Pulmonary:     Effort: Pulmonary effort is normal.     Breath sounds: Normal breath sounds.  Abdominal:     General: Bowel sounds are normal.     Palpations: Abdomen is soft.  Musculoskeletal:        General: Normal range of motion.     Cervical back: Normal range of motion.     Comments: Left hand with mild swelling along the fifth metacarpal, there is no bruising, does have some abrasions to the third  and fourth MCP joints, able to flex and extend, radial pulse intact, normal cap refill  Skin:    General: Skin is warm and dry.  Neurological:     Mental Status: He is alert and oriented to person, place, and time.     ED Results / Procedures / Treatments   Labs (all labs ordered are listed, but only abnormal results are displayed) Labs Reviewed - No data to display  EKG None  Radiology DG Hand Complete Left  Result Date: 03/30/2022 CLINICAL DATA:  Hand pain following punching a wall, initial encounter EXAM: LEFT HAND - COMPLETE 3+ VIEW COMPARISON:  04/16/2020 FINDINGS: There is no evidence of fracture or dislocation. There is no evidence of arthropathy or other focal bone abnormality. Soft tissues are  unremarkable. IMPRESSION: No acute abnormality noted. Electronically Signed   By: Alcide Clever M.D.   On: 03/30/2022 00:42    Procedures Procedures    Medications Ordered in ED Medications  ibuprofen (ADVIL) tablet 800 mg (800 mg Oral Given 03/30/22 0308)    ED Course/ Medical Decision Making/ A&P                           Medical Decision Making Amount and/or Complexity of Data Reviewed Radiology: ordered and independent interpretation performed.  Risk Prescription drug management.   26 y.o. M here with left hand pain after punching a wall yesterday.  Mild swelling along 5th metacarpal, some abrasions to the 3rd and 4th MCP joints.  Hand is NVI.  X-ray negative.  Placed in splint for comfort, tylenol/motrin PRN pain.  Can follow-up with PCP.  Return here for new concerns.  Final Clinical Impression(s) / ED Diagnoses Final diagnoses:  Hand pain, left    Rx / DC Orders ED Discharge Orders     None         Garlon Hatchet, PA-C 03/30/22 0351    Gilda Crease, MD 03/31/22 [phone]

## 2022-03-30 NOTE — Discharge Instructions (Signed)
X-ray today was normal. Tylenol or motrin as needed for pain.  Wear splint for now. Can follow-up with your primary care doctor. Return here for new concerns.

## 2022-05-06 ENCOUNTER — Emergency Department (HOSPITAL_COMMUNITY)
Admission: EM | Admit: 2022-05-06 | Discharge: 2022-05-07 | Disposition: A | Discharge status: Home / Self Care | Payer: Medicaid Other | Attending: Emergency Medicine | Admitting: Emergency Medicine

## 2022-05-06 ENCOUNTER — Other Ambulatory Visit: Payer: Self-pay

## 2022-05-06 DIAGNOSIS — M25562 Pain in left knee: Secondary | ICD-10-CM | POA: Insufficient documentation

## 2022-05-06 DIAGNOSIS — M25512 Pain in left shoulder: Secondary | ICD-10-CM | POA: Diagnosis not present

## 2022-05-06 DIAGNOSIS — G8929 Other chronic pain: Secondary | ICD-10-CM | POA: Insufficient documentation

## 2022-05-06 NOTE — ED Triage Notes (Signed)
Patient reports chronic left knee and low back pain for several years worse today , denies recent fall/ambulatory .

## 2022-05-07 ENCOUNTER — Emergency Department (HOSPITAL_COMMUNITY): Payer: Medicaid Other

## 2022-05-07 NOTE — ED Provider Triage Note (Signed)
Emergency Medicine Provider Triage Evaluation Note  [name] , a 27 y.o. male  was evaluated in triage.  Pt complains of L knee pain for years.   Seems he has new L shoulder pain (new being within a year from now).   PT has poor understanding of chronic pain  Review of Systems  Positive: L shoulder pain, L knee pain Negative: Fever   Physical Exam  BP 109/76 (BP Location: Right Arm)   Pulse (!) 112   Temp 98.5 F (36.9 C)   Resp 16   SpO2 96%  Gen:   Awake, no distress   Resp:  Normal effort  MSK:   Moves extremities without difficulty  Other:    Medical Decision Making  Medically screening exam initiated at 12:00 AM.  Appropriate orders placed.  [name] was informed that the remainder of the evaluation will be completed by another provider, this initial triage assessment does not replace that evaluation, and the importance of remaining in the ED until their evaluation is complete.  [address]    [name] [place], [place] 05/07/22 0003

## 2022-05-07 NOTE — ED Provider Notes (Signed)
[facility] Provider Note   CSN: [id number] Arrival date & time: 05/06/22  2112     History  Chief Complaint  Patient presents with   Left Knee Pain ( Chronic)     [name] is a 27 y.o. male with multiple psychiatric comorbidities who presents to the ED complaining of left knee pain for at least 3 to 4 years as well as left shoulder pain for the last year.  Patient has had no recent injury or trauma to the shoulder or knee.  He states that he has been taking "ibuprofen 2000 mg" for his pain at home but this is not helpful.  Patient appears to have a very poor understanding of chronic pain and use of over-the-counter medications.  He does not have a primary care provider and states that has not been previously evaluated for these complaints.  He is able to ambulate without difficulty and is also able to move left upper extremity without any difficulty. He denies fever, chills, chest pain, shortness of breath, or any other complaints.       Home Medications Prior to Admission medications   Medication Sig Start Date End Date Taking? Authorizing Provider  acetaminophen (TYLENOL 8 HOUR) 650 MG CR tablet Take 1 tablet (650 mg total) by mouth every 8 (eight) hours as needed for pain. 02/06/21   Hayden Rasmussen, MD  albuterol (VENTOLIN HFA) 108 (90 Base) MCG/ACT inhaler Inhale 1-2 puffs into the lungs every 6 (six) hours as needed for wheezing or shortness of breath. 12/14/19   Rayna Sexton, PA-C  ibuprofen (ADVIL) 600 MG tablet Take 1 tablet (600 mg total) by mouth every 6 (six) hours as needed. 01/10/22   Dion Saucier A, PA  lidocaine (LIDODERM) 5 % Place 1 patch onto the skin daily as needed. Apply patch to area most significant pain once per day.  Remove and discard patch within 12 hours of application. 05/31/20   Petrucelli, Samantha R, PA-C  methocarbamol (ROBAXIN) 500 MG tablet Take 1 tablet (500 mg total) by mouth 2 (two) times  daily. 01/27/21   McDonald, Mia A, PA-C  predniSONE (DELTASONE) 10 MG tablet Take 4 tablets (40 mg total) by mouth daily. 01/10/22   Wilnette Kales, PA  triamcinolone cream (KENALOG) 0.1 % Apply 1 application topically 2 (two) times daily. 12/16/20   Muthersbaugh, Jarrett Soho, PA-C  diphenhydrAMINE (BENADRYL) 25 MG tablet Take 1 tablet (25 mg total) by mouth every 6 (six) hours as needed. Patient not taking: Reported on 02/12/2020 07/31/19 05/27/20  Pattricia Boss, MD  famotidine (PEPCID) 20 MG tablet Take 1 tablet (20 mg total) by mouth 2 (two) times daily. Patient not taking: Reported on 02/12/2020 10/02/19 05/27/20  Fredia Sorrow, MD      Allergies    Daytrana [methylphenidate]    Review of Systems   Review of Systems  Constitutional:  Negative for chills and fever.  HENT:  Negative for ear pain and sore throat.   Eyes:  Negative for pain and visual disturbance.  Respiratory:  Negative for cough and shortness of breath.   Cardiovascular:  Negative for chest pain and palpitations.  Gastrointestinal:  Negative for abdominal pain, diarrhea, nausea and vomiting.  Genitourinary:  Negative for dysuria and hematuria.  Musculoskeletal:  Negative for back pain, gait problem and neck pain.       Left knee pain, Left shoulder pain  Skin:  Negative for color change and rash.  Neurological:  Negative for  seizures and syncope.  All other systems reviewed and are negative.   Physical Exam Updated Vital Signs BP 105/78 (BP Location: Left Arm)   Pulse 85   Temp 98.2 F (36.8 C) (Oral)   Resp 16   SpO2 96%  Physical Exam Vitals and nursing note reviewed.  Constitutional:      General: He is not in acute distress.    Appearance: Normal appearance. He is not toxic-appearing.  HENT:     Head: Normocephalic and atraumatic.     Mouth/Throat:     Mouth: Mucous membranes are moist.  Eyes:     Extraocular Movements: Extraocular movements intact.     Conjunctiva/sclera: Conjunctivae normal.   Cardiovascular:     Rate and Rhythm: Normal rate and regular rhythm.     Heart sounds: No murmur heard. Pulmonary:     Effort: Pulmonary effort is normal.     Breath sounds: Normal breath sounds.  Abdominal:     General: Abdomen is flat.     Palpations: Abdomen is soft.     Tenderness: There is no abdominal tenderness.  Musculoskeletal:        General: Normal range of motion.     Cervical back: Normal range of motion and neck supple. No rigidity or tenderness.     Right lower leg: No edema.     Left lower leg: No edema.     Comments: No tenderness to left knee or left lower leg, no deformity, sensation intact, distal pulses intact, full range of motion, no overlying skin changes; no tenderness to anterior or posterior left shoulder, no deformity, no overlying skin changes, full range of motion  Skin:    General: Skin is warm and dry.     Capillary Refill: Capillary refill takes less than 2 seconds.  Neurological:     Mental Status: He is alert. Mental status is at baseline.     ED Results / Procedures / Treatments   Labs (all labs ordered are listed, but only abnormal results are displayed) Labs Reviewed - No data to display  EKG None  Radiology DG Shoulder Left  Result Date: 05/07/2022 CLINICAL DATA:  Left shoulder and left knee pain. EXAM: LEFT SHOULDER - 2+ VIEW; LEFT KNEE - COMPLETE 4+ VIEW COMPARISON:  11/05/2018. FINDINGS: Left knee: There is no evidence of fracture or dislocation. There is no evidence of arthropathy or other focal bone abnormality. Soft tissues are unremarkable. Left shoulder: No acute fracture or dislocation. Joint space is maintained. The soft tissues are within normal limits. IMPRESSION: Negative. Electronically Signed   By: Brett Fairy M.D.   On: 05/07/2022 01:03   DG Knee Complete 4 Views Left  Result Date: 05/07/2022 CLINICAL DATA:  Left shoulder and left knee pain. EXAM: LEFT SHOULDER - 2+ VIEW; LEFT KNEE - COMPLETE 4+ VIEW COMPARISON:   11/05/2018. FINDINGS: Left knee: There is no evidence of fracture or dislocation. There is no evidence of arthropathy or other focal bone abnormality. Soft tissues are unremarkable. Left shoulder: No acute fracture or dislocation. Joint space is maintained. The soft tissues are within normal limits. IMPRESSION: Negative. Electronically Signed   By: Brett Fairy M.D.   On: 05/07/2022 01:03    Procedures Procedures   Medications Ordered in ED Medications - No data to display  ED Course/ Medical Decision Making/ A&P  Medical Decision Making  This is a 27 year old male complaining of left shoulder and left knee pain, both of which are chronic and have been ongoing for 1+ years.  Patient has had no acute fall or injury to either joint.  He has intact range of motion and gait.  Low suspicion for acute fracture, dislocation, or other acute bony injury or process, however, patient was evaluated in triage due to the extended ED wait time and imaging was ordered for further assessment.  Both left knee x-ray and left shoulder x-ray were negative.  On my exam, patient has no overlying tenderness to either joint, no skin changes, no joint instability, is neurovascularly intact, and has full range of motion of all extremities.  Patient aware of these findings and recommendation that he establish outpatient primary care follow-up for better long-term management of his chronic pain and/or any need to be further evaluated by orthopedics.  On my reexamination, I discussed these findings and recommendations with the patient who stated that "I am only here for a knee brace and to get something to eat."  Discussed with patient that with his chronic pain and no acute injury I do not believe that bracing of the knee will help with his symptoms.  He seems to have a very poor understanding of chronic pain became increasingly agitated and continuously requested a knee brace.  Again, discussed with  patient that I do not believe this is indicated but as it likely does not pose significant harm will provide this to him with recommendation that he use only short-term and as needed.  Outpatient primary care clinic follow-up provided.  Patient stable for discharge.         Final Clinical Impression(s) / ED Diagnoses Final diagnoses:  Other chronic pain  Chronic pain of left knee  Chronic left shoulder pain    Rx / DC Orders ED Discharge Orders     None         Tonette Lederer, PA-C 05/07/22 1035    Franne Forts, DO 05/08/22 [phone]

## 2022-05-07 NOTE — Discharge Instructions (Addendum)
Please use Tylenol or ibuprofen for pain.  You may use 600-800 mg ibuprofen every 6 hours or 1000 mg of Tylenol every 6 hours.  You may choose to alternate between the 2.  This would be most effective.  Do not exceed 4 g of Tylenol within 24 hours. Do not exceed 3200 mg ibuprofen 24 hours.  You can apply some ice or heat directly to the area where it hurts.  I recommend you follow up to establish primary care for control of your chronic pain. I have provided the names and information for 2 clinics you may contact to set up an appointment.   If you have worsening pain, new numbness, new injury, or other concerns, please return to the emergency department.  If your pain persists despite treatment I recommend that you follow-up with orthopedics for further evaluation.

## 2022-06-02 ENCOUNTER — Other Ambulatory Visit: Payer: Self-pay

## 2022-06-02 ENCOUNTER — Emergency Department (HOSPITAL_COMMUNITY)
Admission: EM | Admit: 2022-06-02 | Discharge: 2022-06-02 | Discharge status: Against Medical Advice | Payer: Medicaid Other | Attending: Emergency Medicine | Admitting: Emergency Medicine

## 2022-06-02 ENCOUNTER — Encounter (HOSPITAL_COMMUNITY): Payer: Self-pay

## 2022-06-02 DIAGNOSIS — Z5321 Procedure and treatment not carried out due to patient leaving prior to being seen by health care provider: Secondary | ICD-10-CM | POA: Insufficient documentation

## 2022-06-02 DIAGNOSIS — R1110 Vomiting, unspecified: Secondary | ICD-10-CM | POA: Diagnosis present

## 2022-06-02 LAB — CBC
HCT: 46.9 % (ref 39.0–52.0)
Hemoglobin: 15.7 g/dL (ref 13.0–17.0)
MCH: 29.9 pg (ref 26.0–34.0)
MCHC: 33.5 g/dL (ref 30.0–36.0)
MCV: 89.3 fL (ref 80.0–100.0)
Platelets: 223 10*3/uL (ref 150–400)
RBC: 5.25 MIL/uL (ref 4.22–5.81)
RDW: 12.6 % (ref 11.5–15.5)
WBC: 18.9 10*3/uL — ABNORMAL HIGH (ref 4.0–10.5)
nRBC: 0 % (ref 0.0–0.2)

## 2022-06-02 LAB — URINALYSIS, ROUTINE W REFLEX MICROSCOPIC
Glucose, UA: NEGATIVE mg/dL
Hgb urine dipstick: NEGATIVE
Ketones, ur: NEGATIVE mg/dL
Nitrite: NEGATIVE
Protein, ur: 100 mg/dL — AB
Specific Gravity, Urine: 1.027 (ref 1.005–1.030)
WBC, UA: >50 WBC/hpf (ref 0–5)
pH: 5 (ref 5.0–8.0)

## 2022-06-02 LAB — COMPREHENSIVE METABOLIC PANEL
ALT: 21 U/L (ref 0–44)
AST: 19 U/L (ref 15–41)
Albumin: 4.3 g/dL (ref 3.5–5.0)
Alkaline Phosphatase: 86 U/L (ref 38–126)
Anion gap: 8 (ref 5–15)
BUN: 11 mg/dL (ref 6–20)
CO2: 25 mmol/L (ref 22–32)
Calcium: 9.6 mg/dL (ref 8.9–10.3)
Chloride: 107 mmol/L (ref 98–111)
Creatinine, Ser: 0.83 mg/dL (ref 0.61–1.24)
GFR, Estimated: >60 mL/min (ref >60)
Glucose, Bld: 110 mg/dL — ABNORMAL HIGH (ref 70–99)
Potassium: 4.1 mmol/L (ref 3.5–5.1)
Sodium: 140 mmol/L (ref 135–145)
Total Bilirubin: 0.3 mg/dL (ref 0.3–1.2)
Total Protein: 8 g/dL (ref 6.5–8.1)

## 2022-06-02 LAB — LIPASE, BLOOD: Lipase: 25 U/L (ref 11–51)

## 2022-06-02 NOTE — ED Notes (Signed)
Pt states he is leaving because he can't miss the last bus. Pt encouraged to stay for treatment. Pt seen ambulating leaving of the dept.

## 2022-06-02 NOTE — ED Triage Notes (Signed)
Pt arrived via [facility] from [facility] for vomiting that started at 1600 today. Pt has vomited six times today.

## 2022-06-02 NOTE — ED Notes (Addendum)
Pt asking about getting a CT scan and wondering when he will be roomed so he doesn't miss the bus.  Pt getting mildly agitated and this tech seemed to make it work with verbal deescalation.

## 2022-06-06 ENCOUNTER — Emergency Department (HOSPITAL_COMMUNITY): Payer: Medicaid Other

## 2022-06-06 ENCOUNTER — Emergency Department (HOSPITAL_COMMUNITY)
Admission: EM | Admit: 2022-06-06 | Discharge: 2022-06-06 | Disposition: A | Discharge status: Home / Self Care | Payer: Medicaid Other | Attending: Emergency Medicine | Admitting: Emergency Medicine

## 2022-06-06 ENCOUNTER — Encounter (HOSPITAL_COMMUNITY): Payer: Self-pay

## 2022-06-06 ENCOUNTER — Other Ambulatory Visit: Payer: Self-pay

## 2022-06-06 DIAGNOSIS — G8929 Other chronic pain: Secondary | ICD-10-CM | POA: Diagnosis not present

## 2022-06-06 DIAGNOSIS — M25562 Pain in left knee: Secondary | ICD-10-CM | POA: Insufficient documentation
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
## 2022-06-06 NOTE — Discharge Instructions (Signed)
Get help right away if: Your knee swells and the swelling becomes worse. You cannot move your knee. You have severe knee pain.
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
## 2022-07-26 ENCOUNTER — Other Ambulatory Visit: Payer: Self-pay

## 2022-07-26 ENCOUNTER — Encounter (HOSPITAL_COMMUNITY): Payer: Self-pay

## 2022-07-26 ENCOUNTER — Emergency Department (HOSPITAL_COMMUNITY)
Admission: EM | Admit: 2022-07-26 | Discharge: 2022-07-27 | Disposition: A | Discharge status: Home / Self Care | Payer: Medicaid Other | Attending: Emergency Medicine | Admitting: Emergency Medicine

## 2022-07-26 ENCOUNTER — Emergency Department (HOSPITAL_COMMUNITY): Payer: Medicaid Other

## 2022-07-26 DIAGNOSIS — M79605 Pain in left leg: Secondary | ICD-10-CM | POA: Diagnosis not present

## 2022-07-26 DIAGNOSIS — M25562 Pain in left knee: Secondary | ICD-10-CM | POA: Diagnosis present

## 2022-07-26 NOTE — ED Triage Notes (Signed)
Patient [name] from home with complaint of generalized body aches, denies fever or cold sympltoms

## 2022-07-26 NOTE — ED Provider Triage Note (Signed)
Emergency Medicine Provider Triage Evaluation Note  Christ Kirn , a 27 y.o. male  was evaluated in triage.  Complains of back and left knee pain.  Says these are chronic problems but acutely worse.  Review of Systems  Positive:  Negative:   Physical Exam  BP 120/78 (BP Location: Right Arm)   Pulse 86   Temp 98.4 F (36.9 C) (Oral)   Resp 16   Ht 5\' 9"  (1.753 m)   Wt 69.4 kg   SpO2 100%   BMI 22.59 kg/m  Gen:   Awake, no distress   Resp:  Normal effort  MSK:   Moves extremities without difficulty  Other:  Midline tenderness T and L-spine.  Low suspicion any findings.  Medical Decision Making  Medically screening exam initiated at 11:04 PM.  Appropriate orders placed.  Chadney Gerardot was informed that the remainder of the evaluation will be completed by another provider, this initial triage assessment does not replace that evaluation, and the importance of remaining in the ED until their evaluation is complete.   X-rays ordered.  Likely will be benign and patient will be discharged accordingly   Ivette Castronova A, PA-C 07/26/22 2305

## 2022-07-27 MED ORDER — PREDNISONE 20 MG PO TABS: ORAL_TABLET | ORAL | 0 refills | Status: AC

## 2022-07-27 MED ORDER — IBUPROFEN 800 MG PO TABS
800.0000 mg | ORAL_TABLET | Freq: Once | ORAL | Status: AC
  Administered 2022-07-27: 800 mg via ORAL
  Filled 2022-07-27: qty 1

## 2022-07-27 MED ORDER — ACETAMINOPHEN ER 650 MG PO TBCR: 650.0000 mg | EXTENDED_RELEASE_TABLET | Freq: Three times a day (TID) | ORAL | 0 refills | Status: AC | PRN

## 2022-07-27 MED ORDER — MELOXICAM 7.5 MG PO TABS: 7.5000 mg | ORAL_TABLET | Freq: Every day | ORAL | 0 refills | Status: AC

## 2022-07-27 MED ORDER — PREDNISONE 20 MG PO TABS
60.0000 mg | ORAL_TABLET | Freq: Once | ORAL | Status: AC
  Administered 2022-07-27: 60 mg via ORAL
  Filled 2022-07-27: qty 3

## 2022-07-27 NOTE — ED Provider Notes (Signed)
[place] [facility]PARTMENT AT [facility] Provider Note   CSN: [id number] Arrival date & time: 07/26/22  2139     History  Chief Complaint  Patient presents with   Generalized Body Aches    [name] is a 27 y.o. male.  27-year male presents ER today with acute exacerbation of his chronic left knee pain also associated with a shooting pain from his back down his left leg.  States it feels like previous episodes of sciatica.  Patient into getting prior to,.'s been going about 24 hours.  States less than and naproxen it kept him up for 6 hours.  No recent injuries.  No recent illnesses, evers or cough.    Home Medications Prior to Admission medications   Medication Sig Start Date End Date Taking? Authorizing Provider  meloxicam (MOBIC) 7.5 MG tablet Take 1 tablet (7.5 mg total) by mouth daily for 10 days. 07/27/22 08/06/22 Yes Rigel Filsinger, Barbara Cower, MD  predniSONE (DELTASONE) 20 MG tablet 3 tabs po daily x 3 days, then 2 tabs x 3 days, then 1.5 tabs x 3 days, then 1 tab x 3 days, then 0.5 tabs x 3 days 07/27/22  Yes Willetta York, Barbara Cower, MD  acetaminophen (TYLENOL 8 HOUR) 650 MG CR tablet Take 1 tablet (650 mg total) by mouth every 8 (eight) hours as needed for pain. 07/27/22   Elener Custodio, Barbara Cower, MD  albuterol (VENTOLIN HFA) 108 (90 Base) MCG/ACT inhaler Inhale 1-2 puffs into the lungs every 6 (six) hours as needed for wheezing or shortness of breath. 12/14/19   Placido Sou, PA-C  diphenhydrAMINE (BENADRYL) 25 MG tablet Take 1 tablet (25 mg total) by mouth every 6 (six) hours as needed. Patient not taking: Reported on 02/12/2020 07/31/19 05/27/20  Margarita Grizzle, MD  famotidine (PEPCID) 20 MG tablet Take 1 tablet (20 mg total) by mouth 2 (two) times daily. Patient not taking: Reported on 02/12/2020 10/02/19 05/27/20  Vanetta Mulders, MD      Allergies    Daytrana [methylphenidate]    Review of Systems   Review of Systems  Physical Exam Updated Vital Signs BP 116/76   Pulse 72    Temp 98.4 F (36.9 C) (Oral)   Resp 15   Ht 5\' 9"  (1.753 m)   Wt 69.4 kg   SpO2 100%   BMI 22.59 kg/m  Physical Exam Vitals and nursing note reviewed.  Constitutional:      Appearance: He is well-developed.  HENT:     Head: Normocephalic and atraumatic.  Eyes:     Pupils: Pupils are equal, round, and reactive to light.  Cardiovascular:     Rate and Rhythm: Normal rate.  Pulmonary:     Effort: Pulmonary effort is normal. No respiratory distress.  Abdominal:     General: Abdomen is flat. There is no distension.  Musculoskeletal:        General: No swelling, tenderness, deformity or signs of injury. Normal range of motion.     Cervical back: Normal range of motion.  Skin:    General: Skin is dry.     Coloration: Skin is not jaundiced.  Neurological:     Mental Status: He is alert.     ED Results / Procedures / Treatments   Labs (all labs ordered are listed, but only abnormal results are displayed) Labs Reviewed - No data to display  EKG None  Radiology DG Lumbar Spine Complete  Result Date: 07/26/2022 CLINICAL DATA:  Back pain EXAM: LUMBAR SPINE - COMPLETE 4+  VIEW COMPARISON:  02/06/2021 FINDINGS: Mild dextrocurvature.  Vertebral body heights are maintained. IMPRESSION: No acute osseous abnormality Electronically Signed   By: Jasmine Pang M.D.   On: 07/26/2022 23:53   DG Thoracic Spine 2 View  Result Date: 07/26/2022 CLINICAL DATA:  Back pain EXAM: THORACIC SPINE 2 VIEWS COMPARISON:  None Available. FINDINGS: There is no evidence of thoracic spine fracture. Alignment is normal. No other significant bone abnormalities are identified. IMPRESSION: Negative. Electronically Signed   By: Jasmine Pang M.D.   On: 07/26/2022 23:52   DG Knee Complete 4 Views Left  Result Date: 07/26/2022 CLINICAL DATA:  Pain EXAM: LEFT KNEE - COMPLETE 4+ VIEW COMPARISON:  Left knee x-ray 06/06/2022 FINDINGS: No evidence of fracture, dislocation, or joint effusion. No evidence of arthropathy or  other focal bone abnormality. Soft tissues are unremarkable. IMPRESSION: Negative. Electronically Signed   By: Darliss Cheney M.D.   On: 07/26/2022 23:51    Procedures Procedures    Medications Ordered in ED Medications  ibuprofen (ADVIL) tablet 800 mg (has no administration in time range)  predniSONE (DELTASONE) tablet 60 mg (has no administration in time range)    ED Course/ Medical Decision Making/ A&P                             Medical Decision Making Risk OTC drugs. Prescription drug management.   No evidence for acute causes for his worsening chronic pain such as septic arthritis, fracture, dislocation.  Will treat for sciatica and arthritis take and he will follow-up with his PCP for further management.  Final Clinical Impression(s) / ED Diagnoses Final diagnoses:  Pain of left lower extremity    Rx / DC Orders ED Discharge Orders          Ordered    meloxicam (MOBIC) 7.5 MG tablet  Daily        07/27/22 0125    predniSONE (DELTASONE) 20 MG tablet        07/27/22 0125    acetaminophen (TYLENOL 8 HOUR) 650 MG CR tablet  Every 8 hours PRN        07/27/22 0125              Horton Ellithorpe, Barbara Cower, MD 07/27/22 0127
